# Patient Record
Sex: Female | Born: 1935 | Race: White | Hispanic: No | Smoking: Never smoker
Health system: Southern US, Community
[De-identification: ages and names within clinical notes are randomized; demographics above are authoritative.]

## PROBLEM LIST (undated history)

## (undated) DIAGNOSIS — I10 Essential (primary) hypertension: Secondary | ICD-10-CM

## (undated) DIAGNOSIS — E119 Type 2 diabetes mellitus without complications: Secondary | ICD-10-CM

## (undated) DIAGNOSIS — L570 Actinic keratosis: Secondary | ICD-10-CM

## (undated) HISTORY — DX: Actinic keratosis: L57.0

## (undated) HISTORY — PX: BACK SURGERY: SHX140

---

## 2003-12-06 ENCOUNTER — Ambulatory Visit: Payer: Self-pay | Admitting: Internal Medicine

## 2003-12-28 ENCOUNTER — Ambulatory Visit: Payer: Self-pay | Admitting: Internal Medicine

## 2005-03-21 ENCOUNTER — Ambulatory Visit: Payer: Self-pay | Admitting: Internal Medicine

## 2006-05-30 ENCOUNTER — Ambulatory Visit: Payer: Self-pay | Admitting: Internal Medicine

## 2006-06-14 ENCOUNTER — Encounter: Admission: RE | Admit: 2006-06-14 | Discharge: 2006-06-14 | Payer: Self-pay | Admitting: Orthopedic Surgery

## 2006-06-21 ENCOUNTER — Ambulatory Visit: Payer: Self-pay | Admitting: Internal Medicine

## 2006-06-24 ENCOUNTER — Ambulatory Visit (HOSPITAL_COMMUNITY): Admission: RE | Admit: 2006-06-24 | Discharge: 2006-06-25 | Payer: Self-pay | Admitting: Orthopedic Surgery

## 2006-07-15 ENCOUNTER — Ambulatory Visit: Payer: Self-pay | Admitting: Internal Medicine

## 2006-08-05 ENCOUNTER — Encounter (INDEPENDENT_AMBULATORY_CARE_PROVIDER_SITE_OTHER): Payer: Self-pay | Admitting: Gastroenterology

## 2006-08-05 ENCOUNTER — Ambulatory Visit (HOSPITAL_COMMUNITY): Admission: RE | Admit: 2006-08-05 | Discharge: 2006-08-05 | Payer: Self-pay | Admitting: Gastroenterology

## 2006-10-29 ENCOUNTER — Ambulatory Visit: Payer: Self-pay | Admitting: Family Medicine

## 2006-10-29 DIAGNOSIS — M810 Age-related osteoporosis without current pathological fracture: Secondary | ICD-10-CM | POA: Insufficient documentation

## 2006-10-29 DIAGNOSIS — E1165 Type 2 diabetes mellitus with hyperglycemia: Secondary | ICD-10-CM | POA: Insufficient documentation

## 2006-10-29 DIAGNOSIS — I10 Essential (primary) hypertension: Secondary | ICD-10-CM | POA: Insufficient documentation

## 2006-10-29 DIAGNOSIS — E039 Hypothyroidism, unspecified: Secondary | ICD-10-CM | POA: Insufficient documentation

## 2006-10-29 DIAGNOSIS — E119 Type 2 diabetes mellitus without complications: Secondary | ICD-10-CM | POA: Insufficient documentation

## 2007-09-02 ENCOUNTER — Ambulatory Visit: Payer: Self-pay | Admitting: Internal Medicine

## 2008-06-29 DIAGNOSIS — Z86018 Personal history of other benign neoplasm: Secondary | ICD-10-CM

## 2008-06-29 HISTORY — DX: Personal history of other benign neoplasm: Z86.018

## 2008-09-02 ENCOUNTER — Ambulatory Visit: Payer: Self-pay | Admitting: Internal Medicine

## 2009-02-21 IMAGING — CR DG LUMBAR SPINE 2-3V
2 series · 2 of 2 positions shown · non-contrast
Comparison: none

CLINICAL DATA: Disc surgery L4-5. 
 PORTABLE LATERAL LUMBAR SPINE ? 2 VIEW:

[view not recorded (1 of 2)]
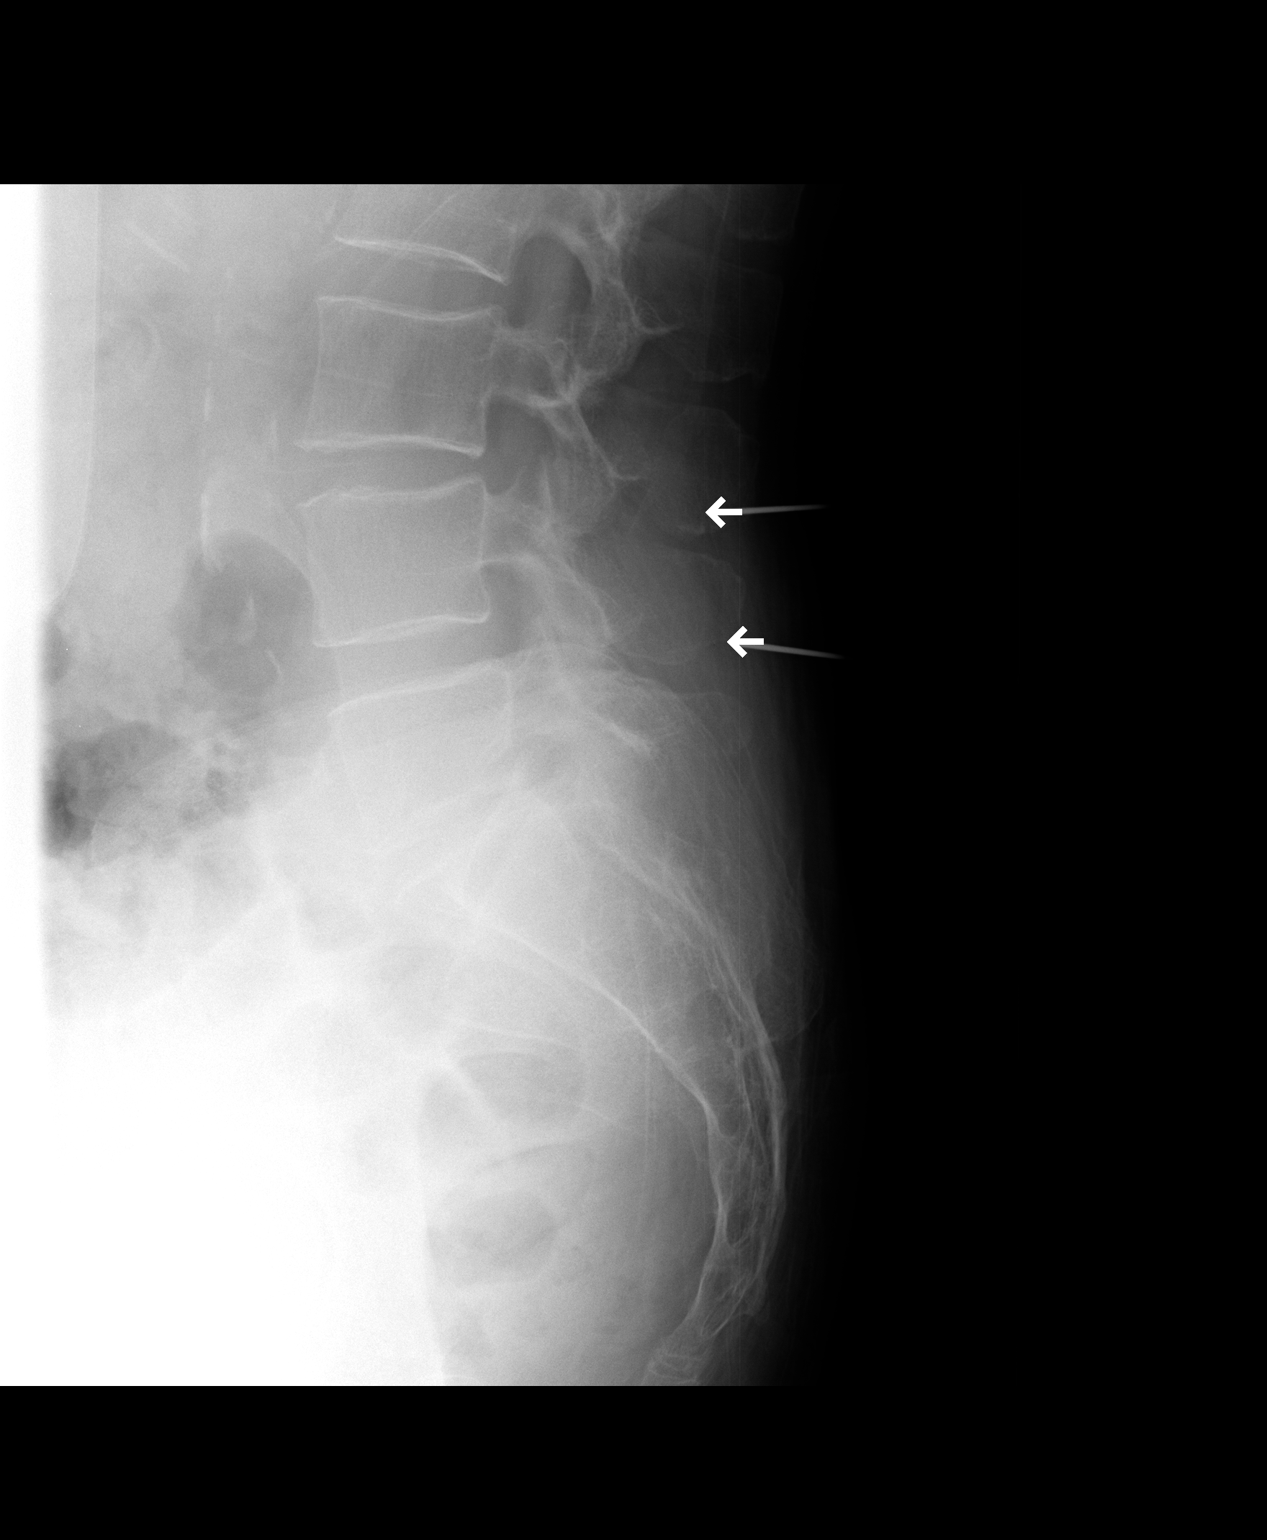

[view not recorded (2 of 2)]
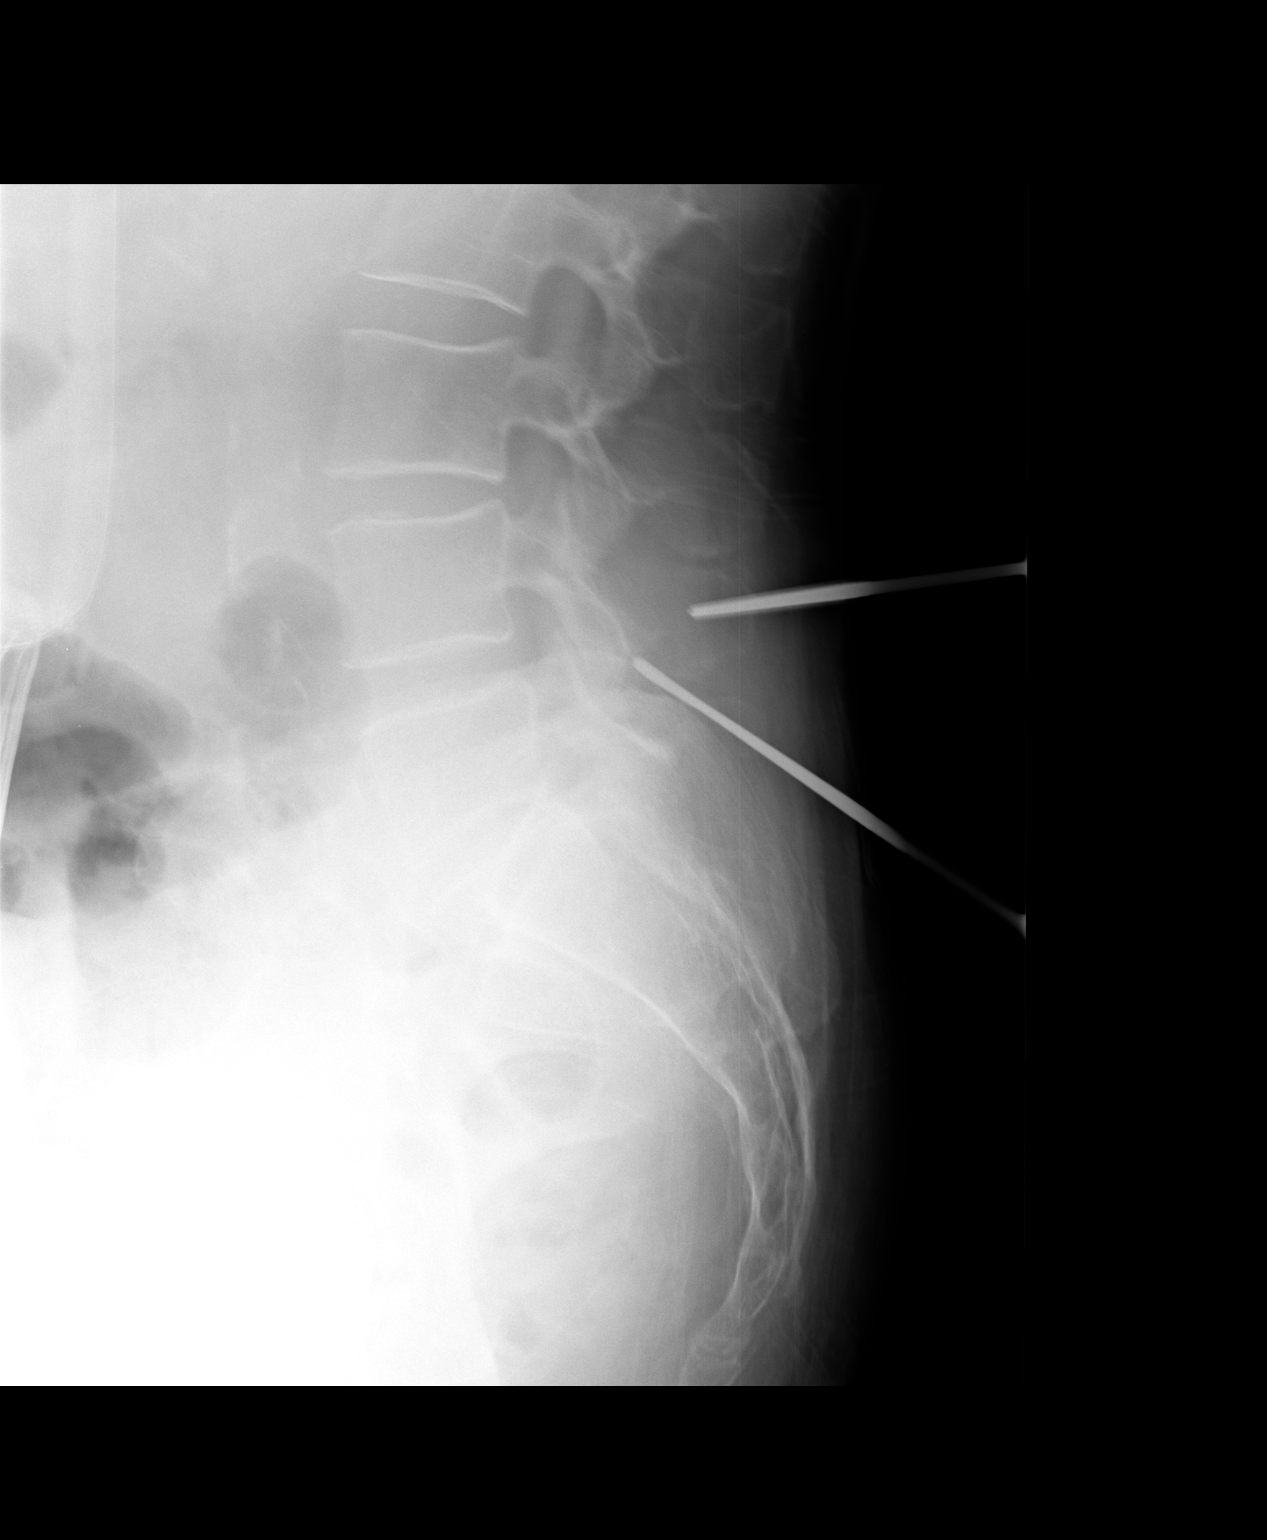

[2 of 2 positions shown; findings below may reference images not displayed]

FINDINGS: An initial film shows needles at the spinous processes of L3 and L4.  
 The second film shows a clamp on the spinous process of L4 with a probe directed at the L4-5 disc space level.
IMPRESSION: As discussed above.

## 2009-09-20 ENCOUNTER — Ambulatory Visit: Payer: Self-pay | Admitting: Internal Medicine

## 2010-07-11 NOTE — Op Note (Signed)
NAME:  Wanda Baird, Wanda Baird               ACCOUNT NO.:  [id number]   MEDICAL RECORD NO.:  [id number]          PATIENT TYPE:  AMB   LOCATION:  ENDO                         FACILITY:  [facility]   PHYSICIAN:  Anselmo Rod, M.D.  DATE OF BIRTH:  1927-09-12   DATE OF PROCEDURE:  08/05/2006  DATE OF DISCHARGE:                               OPERATIVE REPORT   PROCEDURE PERFORMED:  Colonoscopy with snare polypectomy x5.   ENDOSCOPIST:  Anselmo Rod, M.D.   INSTRUMENT USED:  Pentax video colonoscope.   INDICATIONS FOR PROCEDURE:  A 77 year old white female with a history of  change in bowel habits, undergoing a screening colonoscopy to rule out  colonic polyps, masses, etc.   PREPROCEDURE PREPARATION:  Informed consent was procured from the  patient. The patient fasted for 4 hours prior to the procedure and was  prepped with MoviPrep the night of and the morning of the procedure.  Risks and benefits of the procedure, including a 10% miss rate of cancer  and polyp, were discussed with the patient as well.   PREPROCEDURE PHYSICAL EXAMINATION:  VITAL SIGNS:  The patient had stable  vital signs.  NECK:  Supple.  CHEST:  Clear to auscultation.  CARDIOVASCULAR:  S1, S2 regular.  ABDOMEN:  Soft, with normal bowel sounds.   DESCRIPTION OF THE PROCEDURE:  The patient was placed in the left  lateral decubitus position, sedated with 50 mcg of Fentanyl and 4 mg of  Versed given intravenously in slow incremental doses.  Once the patient  was adequately sedated and maintained on low-flow oxygen and continuous  cardiac monitoring, the Pentax video colonoscope was advanced from the  rectum to the cecum.  The appendiceal orifice and ileocecal valve were  clearly visualized and photographed.  The terminal ileum appeared  healthy and without lesions.  Four flat polyps were removed by a hot  snare, one from the proximal right colon, two from the mid-right colon,  and one from the distal right colon.  There  was evidence of sigmoid  diverticulosis with stool in some of the diverticula, and one flat polyp  was removed by a hot snare from the rectum.  Retroflexion in the rectum  revealed small internal hemorrhoids.  The patient tolerated the  procedure well, without immediate complications.   IMPRESSION:  1. Small nonbleeding internal hemorrhoids.  2. Flat polyp removed by a hot snare from the rectum.  3. Sigmoid diverticulosis.  4. Four flat polyps removed by a hot snare from the right colon.  5. Normal terminal ileum.   RECOMMENDATIONS:  1. Await pathology results.  2. Avoid all nonsteroidals, including aspirin, for the next 4 weeks.  3. Repeat colonoscopy depending on pathology results.  4. Outpatient followup in the next 2 weeks for further      recommendations.      Anselmo Rod, M.D.  Electronically Signed     JNM/MEDQ  D:  08/05/2006  T:  08/05/2006  Job:  [id number]   cc:   Alonna Buckler, M.D.

## 2010-07-11 NOTE — Op Note (Signed)
NAME:  RYLIEE, FIGGE               ACCOUNT NO.:  [id number]   MEDICAL RECORD NO.:  [id number]          PATIENT TYPE:  AMB   LOCATION:  ENDO                         FACILITY:  [facility]   PHYSICIAN:  Anselmo Rod, M.D.  DATE OF BIRTH:  15-Dec-1927   DATE OF PROCEDURE:  08/05/2006  DATE OF DISCHARGE:                               OPERATIVE REPORT   PROCEDURE PERFORMED:  Esophagogastroduodenoscopy with gastric biopsies x  4.   INSTRUMENT USED:  Pentax video panendoscope.   INDICATIONS FOR PROCEDURE:  A 77 year old, white female with a history  of abdominal pain and reflux undergoing an esophagogastroduodenoscopy to  rule out peptic ulcer disease, esophagitis, gastritis, etc. The patient  had a 12 pound weight loss in the last year.  Preprocedure preparation,  informed consent was obtained from the patient.  The patient fasted for  4 hours prior to the procedure.  The risks and benefits of the procedure  were discussed with the patient in great detail.   PRE-PROCEDURE PHYSICAL:  The patient had stable vital signs.  Neck  supple.  Chest clear to auscultation. S1 and S2 regular; abdomen soft  with normal bowel sounds.   DESCRIPTION OF PROCEDURE:  The patient was placed in the left lateral  decubitus position and sedated with 50 mg of Fentanyl and 6 mg of Versed  given intravenously in incremental doses.  Once the patient was  adequately sedated, maintained on low flow oxygen, continuous cardiac  monitoring.  The Pentex video panendoscope was advanced through the  mouth, over the tongue and into the esophagus under direct vision.  The  entire esophagus was widely patent with no evidence of ring, stricture,  mass, esophagitis or Barrett mucosa.  The scope was then advanced into  the stomach.  Nodular changes were noticed throughout the gastric mucosa  with no problem in changes in the proximal stomach.  Mild antral  gastritis was present.  Biopsies were done to rule out the presence of  H  pylori by pathology.  No hiatal hernia was seen.  Normal high  retroflexion of the proximal small bowel appeared normal.   The patient tolerated the procedure well without complications.   IMPRESSION:  1. Normal appearing esophagus and healthy Z line.  2. Nodular changes in the stomach with mild antral gastritis, biopsy      done to rule out the presence of H pylori by pathology.  3. Normal appearing gastroesophageal junction on high retroflexion.  4. Normal proximal small bowel.   RECOMMENDATIONS:  1. Await the pathology results.  2. Avoid all nonsteroidal anti-inflammatory and aspirin for now.  3. Follow antireflux precautions and use over the counter proton pump      inhibitor as needed.  4. Proceed with colonoscopy at this time.  5. Further recommendations made thereafter.      Anselmo Rod, M.D.  Electronically Signed     JNM/MEDQ  D:  08/05/2006  T:  08/05/2006  Job:  [id number]   cc:   Alonna Buckler, M.D.

## 2010-07-14 NOTE — Op Note (Signed)
[name], [name]               ACCOUNT NO.:  [id number]   MEDICAL RECORD NO.:  [id number]          PATIENT TYPE:  AMB   LOCATION:  DAY                          FACILITY:  [facility]   PHYSICIAN:  Georges Lynch. Gioffre, M.D.DATE OF BIRTH:  [birth year]   DATE OF PROCEDURE:  06/24/2006  DATE OF DISCHARGE:                               OPERATIVE REPORT   SURGEON:  Dr. Darrelyn Hillock.   ASSISTANT:  Dr. Marlowe Kays.   PREOPERATIVE DIAGNOSES:  1. 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  2. Large herniated lumbar disk at L4-5 on the right.  3. Partial foot drop on the right.   OPERATION:  1. Decompression for lateral severe recess stenosis at L4-5 on the      right.  2. Microdiskectomy at L4-5 on the right with foraminotomies.   PROCEDURE:  Under general anesthesia with the patient on a spinal frame,  routine orthopedic prep and draping of  the back was carried out.  She  had 1 gram of IV Ancef preop.  At this time two needles were placed in  the back for localization purposes and x-ray was taken.  An incision  then was made over the L4-5 interspace.  Bleeders identified and  cauterized.  I separated the muscle from the lamina and spinous process  in the usual fashion.  I then went down and took another x-ray to verify  our exact position.   Following that I did a hemilaminectomy at L4-5 on the right and I noted  that the ligamentum flavum was markedly thickened.  She had severe  recess stenosis. so we went far out laterally to decompress the recess  and removed the ligamentum flavum with great care not to injure the  dura.  The microscope was used during the procedure.  I then identified  the L5 root.  It was quite tense.  I did a foraminotomy to free up the  root.  There were several large lateral recessed veins that were  cauterized with a bipolar.   I then made a cruciate incision in the posterior longitudinal ligament  while we protected the dura with the D'Errico  retractor.  The large  amount of disk then was removed.  We utilized a nerve hook and the  Epstein curettes to go up under the subligamentous space and free up the  root.  The root was traced out into the foramina and it was now free.  The lateral recess was thoroughly decompressed.  We were now easily able  to move the root and the dura without any problem.  We thoroughly  irrigated out the area and loosely applied some thrombin-soaked Gelfoam.   I then closed the wound in layers in the usual fashion except the very  proximal and distal deep parts of the wound were slightly left open for  drainage purposes.  Following that the wound then was closed in layers  in the usual  fashion.  I injected 20 mL of 0.5% Marcaine and epinephrine into  the  muscle and soft tissue.  The skin was closed metal staples.  A sterile  Neosporin dressing was applied.  Note prior to leaving surgery she had  15 mg of Toradol IV.  After sterile dressings were applied the patient  left the operating room in satisfactory condition.           ______________________________  Georges Lynch Darrelyn Hillock, M.D.     RAG/MEDQ  D:  06/24/2006  T:  06/25/2006  Job:  [id number]   cc:   Windy Fast A. Darrelyn Hillock, M.D.  Fax: [phone]

## 2010-11-30 ENCOUNTER — Ambulatory Visit: Payer: Self-pay | Admitting: Internal Medicine

## 2011-12-03 ENCOUNTER — Ambulatory Visit: Payer: Self-pay | Admitting: Internal Medicine

## 2012-12-03 ENCOUNTER — Ambulatory Visit: Payer: Self-pay | Admitting: Internal Medicine

## 2013-12-09 ENCOUNTER — Ambulatory Visit: Payer: Self-pay | Admitting: Family Medicine

## 2013-12-24 DIAGNOSIS — C4492 Squamous cell carcinoma of skin, unspecified: Secondary | ICD-10-CM

## 2013-12-24 HISTORY — DX: Squamous cell carcinoma of skin, unspecified: C44.92

## 2014-11-20 ENCOUNTER — Encounter: Payer: Self-pay | Admitting: Emergency Medicine

## 2014-11-20 ENCOUNTER — Emergency Department
Admission: EM | Admit: 2014-11-20 | Discharge: 2014-11-21 | Disposition: A | Discharge status: Home / Self Care | Payer: Medicare Other | Attending: Emergency Medicine | Admitting: Emergency Medicine

## 2014-11-20 DIAGNOSIS — R112 Nausea with vomiting, unspecified: Secondary | ICD-10-CM | POA: Insufficient documentation

## 2014-11-20 DIAGNOSIS — I10 Essential (primary) hypertension: Secondary | ICD-10-CM | POA: Diagnosis not present

## 2014-11-20 DIAGNOSIS — E119 Type 2 diabetes mellitus without complications: Secondary | ICD-10-CM | POA: Insufficient documentation

## 2014-11-20 HISTORY — DX: Type 2 diabetes mellitus without complications: E11.9

## 2014-11-20 LAB — COMPREHENSIVE METABOLIC PANEL
ALBUMIN: 4.4 g/dL (ref 3.5–5.0)
ALK PHOS: 49 U/L (ref 38–126)
ALT: 14 U/L (ref 14–54)
ANION GAP: 12 (ref 5–15)
AST: 22 U/L (ref 15–41)
BILIRUBIN TOTAL: 1.7 mg/dL — AB (ref 0.3–1.2)
BUN: 21 mg/dL — ABNORMAL HIGH (ref 6–20)
CALCIUM: 9.4 mg/dL (ref 8.9–10.3)
CO2: 26 mmol/L (ref 22–32)
Chloride: 103 mmol/L (ref 101–111)
Creatinine, Ser: 0.69 mg/dL (ref 0.44–1.00)
GFR calc Af Amer: >60 mL/min (ref >60)
GLUCOSE: 199 mg/dL — AB (ref 65–99)
Potassium: 4 mmol/L (ref 3.5–5.1)
Sodium: 141 mmol/L (ref 135–145)
TOTAL PROTEIN: 7.3 g/dL (ref 6.5–8.1)

## 2014-11-20 LAB — URINALYSIS COMPLETE WITH MICROSCOPIC (ARMC ONLY)
BILIRUBIN URINE: NEGATIVE
Bacteria, UA: NONE SEEN
Glucose, UA: NEGATIVE mg/dL
Hgb urine dipstick: NEGATIVE
KETONES UR: NEGATIVE mg/dL
LEUKOCYTES UA: NEGATIVE
NITRITE: NEGATIVE
Protein, ur: 30 mg/dL — AB
Specific Gravity, Urine: 1.024 (ref 1.005–1.030)
pH: 5 (ref 5.0–8.0)

## 2014-11-20 LAB — CBC
HCT: 41.7 % (ref 35.0–47.0)
HEMOGLOBIN: 14.6 g/dL (ref 12.0–16.0)
MCH: 30.9 pg (ref 26.0–34.0)
MCHC: 35.1 g/dL (ref 32.0–36.0)
MCV: 88.3 fL (ref 80.0–100.0)
Platelets: 203 10*3/uL (ref 150–440)
RBC: 4.73 MIL/uL (ref 3.80–5.20)
RDW: 13.2 % (ref 11.5–14.5)
WBC: 9.4 10*3/uL (ref 3.6–11.0)

## 2014-11-20 LAB — LIPASE, BLOOD: Lipase: 23 U/L (ref 22–51)

## 2014-11-20 MED ORDER — ONDANSETRON HCL 4 MG/2ML IJ SOLN
INTRAMUSCULAR | Status: AC
  Administered 2014-11-20: 4 mg via INTRAVENOUS
  Filled 2014-11-20: qty 2

## 2014-11-20 MED ORDER — SODIUM CHLORIDE 0.9 % IV BOLUS (SEPSIS)
1000.0000 mL | Freq: Once | INTRAVENOUS | Status: AC
  Administered 2014-11-20: 1000 mL via INTRAVENOUS

## 2014-11-20 MED ORDER — ONDANSETRON HCL 4 MG/2ML IJ SOLN
4.0000 mg | INTRAMUSCULAR | Status: AC
  Administered 2014-11-20: 4 mg via INTRAVENOUS
  Filled 2014-11-20: qty 2

## 2014-11-20 MED ORDER — ONDANSETRON HCL 4 MG PO TABS: ORAL_TABLET | ORAL | Status: DC

## 2014-11-20 MED ORDER — ONDANSETRON HCL 4 MG/2ML IJ SOLN
4.0000 mg | Freq: Once | INTRAMUSCULAR | Status: AC
  Administered 2014-11-20: 4 mg via INTRAVENOUS

## 2014-11-20 NOTE — ED Notes (Signed)
Pt given po fluid challenge

## 2014-11-20 NOTE — ED Notes (Signed)
States woke up at 5 am with nausea and vomiting, denies abd pain, chest pain or SOB. Denies dysuria.

## 2014-11-20 NOTE — Discharge Instructions (Signed)
You have been seen in the Emergency Department (ED) today for nausea and vomiting.  Your work up today has not shown a clear cause for your symptoms. You have been prescribed Zofran; please use as prescribed as needed for your nausea.  Follow up with your doctor as soon as possible regarding todays emergent visit and your symptoms of nausea.   Return to the ED if you develop abdominal, bloody vomiting, bloody diarrhea, if you are unable to tolerate fluids due to vomiting, or if you develop other symptoms that concern you.   Nausea and Vomiting Nausea is a sick feeling that often comes before throwing up (vomiting). Vomiting is a reflex where stomach contents come out of your mouth. Vomiting can cause severe loss of body fluids (dehydration). Children and elderly adults can become dehydrated quickly, especially if they also have diarrhea. Nausea and vomiting are symptoms of a condition or disease. It is important to find the cause of your symptoms. CAUSES   Direct irritation of the stomach lining. This irritation can result from increased acid production (gastroesophageal reflux disease), infection, food poisoning, taking certain medicines (such as nonsteroidal anti-inflammatory drugs), alcohol use, or tobacco use.  Signals from the brain.These signals could be caused by a headache, heat exposure, an inner ear disturbance, increased pressure in the brain from injury, infection, a tumor, or a concussion, pain, emotional stimulus, or metabolic problems.  An obstruction in the gastrointestinal tract (bowel obstruction).  Illnesses such as diabetes, hepatitis, gallbladder problems, appendicitis, kidney problems, cancer, sepsis, atypical symptoms of a heart attack, or eating disorders.  Medical treatments such as chemotherapy and radiation.  Receiving medicine that makes you sleep (general anesthetic) during surgery. DIAGNOSIS Your caregiver may ask for tests to be done if the problems do not  improve after a few days. Tests may also be done if symptoms are severe or if the reason for the nausea and vomiting is not clear. Tests may include:  Urine tests.  Blood tests.  Stool tests.  Cultures (to look for evidence of infection).  X-rays or other imaging studies. Test results can help your caregiver make decisions about treatment or the need for additional tests. TREATMENT You need to stay well hydrated. Drink frequently but in small amounts.You may wish to drink water, sports drinks, clear broth, or eat frozen ice pops or gelatin dessert to help stay hydrated.When you eat, eating slowly may help prevent nausea.There are also some antinausea medicines that may help prevent nausea. HOME CARE INSTRUCTIONS   Take all medicine as directed by your caregiver.  If you do not have an appetite, do not force yourself to eat. However, you must continue to drink fluids.  If you have an appetite, eat a normal diet unless your caregiver tells you differently.  Eat a variety of complex carbohydrates (rice, wheat, potatoes, bread), lean meats, yogurt, fruits, and vegetables.  Avoid high-fat foods because they are more difficult to digest.  Drink enough water and fluids to keep your urine clear or pale yellow.  If you are dehydrated, ask your caregiver for specific rehydration instructions. Signs of dehydration may include:  Severe thirst.  Dry lips and mouth.  Dizziness.  Dark urine.  Decreasing urine frequency and amount.  Confusion.  Rapid breathing or pulse. SEEK IMMEDIATE MEDICAL CARE IF:   You have blood or brown flecks (like coffee grounds) in your vomit.  You have black or bloody stools.  You have a severe headache or stiff neck.  You are confused.  You have severe abdominal pain.  You have chest pain or trouble breathing.  You do not urinate at least once every 8 hours.  You develop cold or clammy skin.  You continue to vomit for longer than 24 to 48  hours.  You have a fever. MAKE SURE YOU:   Understand these instructions.  Will watch your condition.  Will get help right away if you are not doing well or get worse. Document Released: 02/12/2005 Document Revised: 05/07/2011 Document Reviewed: 07/12/2010 [facility] Patient Information 2015 [place], [place]. This information is not intended to replace advice given to you by your health care provider. Make sure you discuss any questions you have with your health care provider.

## 2014-11-20 NOTE — ED Provider Notes (Signed)
[facility] Emergency Department Provider Note  ____________________________________________  Time seen: Approximately 8:51 PM  I have reviewed the triage vital signs and the nursing notes.   HISTORY  Chief Complaint Emesis    HPI Wanda Baird is a 81 y.o. female with no significant past medical history other than uncomplicated diabetes who presents with multiple episodes of nausea and vomiting today.  She states that she thinks that she worked a little bit too hard yesterday and did not drink enough fluid.  That she ate a bowl of cereal right before bed and thinks that this was started her nausea.She denies ever having fever/chills, chest pain, shortness of breath, abdominal pain, diarrhea, dysuria.  She states that she just feels ill and tends to vomit whenever she takes anything by mouth.  The symptoms are serious because of the number of episodes of emesis, but other than that she states that she feels well.   Past Medical History  Diagnosis Date  . Diabetes mellitus without complication     Patient Active Problem List   Diagnosis Date Noted  . HYPOTHYROIDISM 10/29/2006  . DIABETES-TYPE 2 10/29/2006  . HYPERTENSION, ESSENTIAL NOS 10/29/2006  . OSTEOPOROSIS 10/29/2006    Past Surgical History  Procedure Laterality Date  . Back surgery      Current Outpatient Rx  Name  Route  Sig  Dispense  Refill  . ondansetron (ZOFRAN) 4 MG tablet      Take 1-2 tabs by mouth every 8 hours as needed for nausea/vomiting   30 tablet   0     Allergies Codeine  No family history on file.  Social History Social History  Substance Use Topics  . Smoking status: Never Smoker   . Smokeless tobacco: None  . Alcohol Use: No    Review of Systems Constitutional: No fever/chills Eyes: No visual changes. ENT: No sore throat. Cardiovascular: Denies chest pain. Respiratory: Denies shortness of breath. Gastrointestinal: No abdominal pain.  Multiple  episodes of emesis.  No diarrhea.  No constipation. Genitourinary: Negative for dysuria. Musculoskeletal: Negative for back pain. Skin: Negative for rash. Neurological: Negative for headaches, focal weakness or numbness.  10-point ROS otherwise negative.  ____________________________________________   PHYSICAL EXAM:  VITAL SIGNS: ED Triage Vitals  Enc Vitals Group     BP 11/20/14 1852 125/57 mmHg     Pulse Rate 11/20/14 1852 99     Resp 11/20/14 1852 20     Temp 11/20/14 1852 98.7 F (37.1 C)     Temp Source 11/20/14 1852 Oral     SpO2 11/20/14 1852 98 %     Weight 11/20/14 1852 149 lb (67.586 kg)     Height 11/20/14 1852 5\' 4"  (1.626 m)     Head Cir --      Peak Flow --      Pain Score --      Pain Loc --      Pain Edu? --      Excl. in [place]? --     Constitutional: Alert and oriented. Well appearing and in no acute distress.  Appears significantly younger than her chronological age. Eyes: Conjunctivae are normal. PERRL. EOMI. Head: Atraumatic. Nose: No congestion/rhinnorhea. Mouth/Throat: Mucous membranes are moist.  Oropharynx non-erythematous. Neck: No stridor.   Cardiovascular: Normal rate, regular rhythm. Grossly normal heart sounds.  Good peripheral circulation. Respiratory: Normal respiratory effort.  No retractions. Lungs CTAB. Gastrointestinal: Soft and nontender. No distention. No abdominal bruits. No CVA tenderness. Musculoskeletal: No  lower extremity tenderness nor edema.  No joint effusions. Neurologic:  Normal speech and language. No gross focal neurologic deficits are appreciated.  Skin:  Skin is warm, dry and intact. No rash noted. Psychiatric: Mood and affect are normal. Speech and behavior are normal.  The patient is happy and joking with me and in no discomfort at this time.  ____________________________________________   LABS (all labs ordered are listed, but only abnormal results are displayed)  Labs Reviewed  COMPREHENSIVE METABOLIC PANEL -  Abnormal; Notable for the following:    Glucose, Bld 199 (*)    BUN 21 (*)    Total Bilirubin 1.7 (*)    All other components within normal limits  URINALYSIS COMPLETEWITH MICROSCOPIC ([facility] ONLY) - Abnormal; Notable for the following:    Color, Urine YELLOW (*)    APPearance CLEAR (*)    Protein, ur 30 (*)    Squamous Epithelial / LPF 0-5 (*)    All other components within normal limits  LIPASE, BLOOD  CBC   ____________________________________________  EKG  ED ECG REPORT I, FORBACH, CORY, the attending physician, personally viewed and interpreted this ECG.  Date: 11/20/2014 EKG Time: 23:29 Rate: 79 Rhythm: normal sinus rhythm QRS Axis: normal Intervals: normal ST/T Wave abnormalities: normal Conduction Disutrbances: none.  Low voltage EKG Narrative Interpretation: unremarkable.  No evidence of acute ischemia  ____________________________________________  RADIOLOGY   No results found.  ____________________________________________   PROCEDURES  Procedure(s) performed: None  Critical Care performed: No ____________________________________________   INITIAL IMPRESSION / ASSESSMENT AND PLAN / ED COURSE  Pertinent labs & imaging results that were available during my care of the patient were reviewed by me and considered in my medical decision making (see chart for details).  The patient is extraordinarily well appearing for her age and feels much better according to her own report.  I will give her additional nausea medicine and then try by mouth challenge.  I fully anticipated obtaining a CT scan before I went into the room given her age, now well-appearing she looks and feels, if she is able to tolerate by mouth intake I believe she is appropriate for discharge with antiemetics.  I will also obtain an EKG as a general screening exam.   (Note that documentation was delayed due to multiple ED patients requiring immediate care.)   Patient remained well-appearing  on re-examination, and was able to tolerate PO intake.  She very much wants to go home, which I feel is appropriate.  I gave her my usual and customary return precautions. NAD, VSS, no abd tenderness on reassessment, no additional vomiting. ____________________________________________  FINAL CLINICAL IMPRESSION(S) / ED DIAGNOSES  Final diagnoses:  Nausea and vomiting, vomiting of unspecified type      NEW MEDICATIONS STARTED DURING THIS VISIT:  Discharge Medication List as of 11/20/2014 11:28 PM    START taking these medications   Details  ondansetron (ZOFRAN) 4 MG tablet Take 1-2 tabs by mouth every 8 hours as needed for nausea/vomiting, Print         Hinda Kehr, MD 11/21/14 6578

## 2015-03-21 ENCOUNTER — Ambulatory Visit
Admission: RE | Admit: 2015-03-21 | Discharge: 2015-03-21 | Disposition: A | Discharge status: Home / Self Care | Payer: Medicare Other | Source: Ambulatory Visit | Attending: Family Medicine | Admitting: Family Medicine

## 2015-03-21 ENCOUNTER — Other Ambulatory Visit: Payer: Self-pay | Admitting: Family Medicine

## 2015-03-21 DIAGNOSIS — Z1231 Encounter for screening mammogram for malignant neoplasm of breast: Secondary | ICD-10-CM

## 2015-07-29 ENCOUNTER — Encounter: Payer: Self-pay | Admitting: Emergency Medicine

## 2015-07-29 DIAGNOSIS — I10 Essential (primary) hypertension: Secondary | ICD-10-CM | POA: Diagnosis not present

## 2015-07-29 DIAGNOSIS — R197 Diarrhea, unspecified: Secondary | ICD-10-CM | POA: Insufficient documentation

## 2015-07-29 DIAGNOSIS — M810 Age-related osteoporosis without current pathological fracture: Secondary | ICD-10-CM | POA: Insufficient documentation

## 2015-07-29 DIAGNOSIS — E119 Type 2 diabetes mellitus without complications: Secondary | ICD-10-CM | POA: Insufficient documentation

## 2015-07-29 DIAGNOSIS — R112 Nausea with vomiting, unspecified: Secondary | ICD-10-CM | POA: Diagnosis present

## 2015-07-29 LAB — COMPREHENSIVE METABOLIC PANEL
ALBUMIN: 4.4 g/dL (ref 3.5–5.0)
ALT: 15 U/L (ref 14–54)
ANION GAP: 13 (ref 5–15)
AST: 20 U/L (ref 15–41)
Alkaline Phosphatase: 50 U/L (ref 38–126)
BUN: 23 mg/dL — ABNORMAL HIGH (ref 6–20)
CO2: 25 mmol/L (ref 22–32)
Calcium: 9.2 mg/dL (ref 8.9–10.3)
Chloride: 100 mmol/L — ABNORMAL LOW (ref 101–111)
Creatinine, Ser: 0.62 mg/dL (ref 0.44–1.00)
GFR calc non Af Amer: >60 mL/min (ref >60)
GLUCOSE: 251 mg/dL — AB (ref 65–99)
POTASSIUM: 3.4 mmol/L — AB (ref 3.5–5.1)
SODIUM: 138 mmol/L (ref 135–145)
TOTAL PROTEIN: 7.1 g/dL (ref 6.5–8.1)
Total Bilirubin: 1.1 mg/dL (ref 0.3–1.2)

## 2015-07-29 LAB — CBC
HEMATOCRIT: 42.7 % (ref 35.0–47.0)
HEMOGLOBIN: 14.5 g/dL (ref 12.0–16.0)
MCH: 30 pg (ref 26.0–34.0)
MCHC: 34 g/dL (ref 32.0–36.0)
MCV: 88.4 fL (ref 80.0–100.0)
Platelets: 173 10*3/uL (ref 150–440)
RBC: 4.83 MIL/uL (ref 3.80–5.20)
RDW: 13 % (ref 11.5–14.5)
WBC: 14 10*3/uL — ABNORMAL HIGH (ref 3.6–11.0)

## 2015-07-29 LAB — LIPASE, BLOOD: Lipase: 36 U/L (ref 11–51)

## 2015-07-29 MED ORDER — ONDANSETRON 4 MG PO TBDP
ORAL_TABLET | ORAL | Status: AC
  Administered 2015-07-29: 23:00:00
  Filled 2015-07-29: qty 1

## 2015-07-29 MED ORDER — ONDANSETRON 4 MG PO TBDP
4.0000 mg | ORAL_TABLET | Freq: Once | ORAL | Status: AC | PRN
  Administered 2015-07-29: 4 mg via ORAL

## 2015-07-29 NOTE — ED Notes (Signed)
Pt states nausea, vomiting and diarrhea that began around 1900 after eating dinner. Pt states "i have never felt so sick in my life". Pt denies abdominal pain, states "i just feel sick."

## 2015-07-30 ENCOUNTER — Emergency Department: Payer: Medicare Other

## 2015-07-30 ENCOUNTER — Emergency Department
Admission: EM | Admit: 2015-07-30 | Discharge: 2015-07-30 | Disposition: A | Discharge status: Home / Self Care | Payer: Medicare Other | Attending: Emergency Medicine | Admitting: Emergency Medicine

## 2015-07-30 DIAGNOSIS — R197 Diarrhea, unspecified: Secondary | ICD-10-CM | POA: Diagnosis not present

## 2015-07-30 DIAGNOSIS — R112 Nausea with vomiting, unspecified: Secondary | ICD-10-CM

## 2015-07-30 HISTORY — DX: Essential (primary) hypertension: I10

## 2015-07-30 MED ORDER — ONDANSETRON 4 MG PO TBDP: 4.0000 mg | ORAL_TABLET | Freq: Four times a day (QID) | ORAL | Status: DC | PRN

## 2015-07-30 MED ORDER — ONDANSETRON HCL 4 MG/2ML IJ SOLN
INTRAMUSCULAR | Status: AC
  Administered 2015-07-30: 4 mg via INTRAVENOUS
  Filled 2015-07-30: qty 2

## 2015-07-30 MED ORDER — SODIUM CHLORIDE 0.9 % IV BOLUS (SEPSIS)
1000.0000 mL | Freq: Once | INTRAVENOUS | Status: AC
  Administered 2015-07-30: 1000 mL via INTRAVENOUS

## 2015-07-30 MED ORDER — ONDANSETRON HCL 4 MG/2ML IJ SOLN
4.0000 mg | Freq: Once | INTRAMUSCULAR | Status: AC
  Administered 2015-07-30: 4 mg via INTRAVENOUS
  Filled 2015-07-30: qty 2

## 2015-07-30 MED ORDER — ONDANSETRON HCL 4 MG/2ML IJ SOLN
4.0000 mg | Freq: Once | INTRAMUSCULAR | Status: AC
  Administered 2015-07-30: 4 mg via INTRAVENOUS

## 2015-07-30 NOTE — Discharge Instructions (Signed)
You were seen in the emergency room for nausea vomiting and diarrhea. It is important that you follow up closely with your primary care doctor in the next couple of days.  As we discussed I did recommend that you get a CT scan today to make sure he did not have an "infection" or a possible "bowel blockage or obstruction", however after discussion you feel much better and have elected not to have this done but rather to monitor symptoms at home. I think this is okay, but she must return to the ER right away if you are to develop a fever, severe nausea, your pain becomes severe or worsens, you are unable to keep food down, begin vomiting any dark or bloody fluid, you develop any dark or bloody stools, feel dehydrated, or other new concerns or symptoms arise.   If you're unable to see her primary care doctor you may return to the emergency room or go to the [facility] in 1 or 2 days for reexam.    Nausea and Vomiting Nausea is a sick feeling that often comes before throwing up (vomiting). Vomiting is a reflex where stomach contents come out of your mouth. Vomiting can cause severe loss of body fluids (dehydration). Children and elderly adults can become dehydrated quickly, especially if they also have diarrhea. Nausea and vomiting are symptoms of a condition or disease. It is important to find the cause of your symptoms. CAUSES   Direct irritation of the stomach lining. This irritation can result from increased acid production (gastroesophageal reflux disease), infection, food poisoning, taking certain medicines (such as nonsteroidal anti-inflammatory drugs), alcohol use, or tobacco use.  Signals from the brain.These signals could be caused by a headache, heat exposure, an inner ear disturbance, increased pressure in the brain from injury, infection, a tumor, or a concussion, pain, emotional stimulus, or metabolic problems.  An obstruction in the gastrointestinal tract (bowel  obstruction).  Illnesses such as diabetes, hepatitis, gallbladder problems, appendicitis, kidney problems, cancer, sepsis, atypical symptoms of a heart attack, or eating disorders.  Medical treatments such as chemotherapy and radiation.  Receiving medicine that makes you sleep (general anesthetic) during surgery. DIAGNOSIS Your caregiver may ask for tests to be done if the problems do not improve after a few days. Tests may also be done if symptoms are severe or if the reason for the nausea and vomiting is not clear. Tests may include:  Urine tests.  Blood tests.  Stool tests.  Cultures (to look for evidence of infection).  X-rays or other imaging studies. Test results can help your caregiver make decisions about treatment or the need for additional tests. TREATMENT You need to stay well hydrated. Drink frequently but in small amounts.You may wish to drink water, sports drinks, clear broth, or eat frozen ice pops or gelatin dessert to help stay hydrated.When you eat, eating slowly may help prevent nausea.There are also some antinausea medicines that may help prevent nausea. HOME CARE INSTRUCTIONS   Take all medicine as directed by your caregiver.  If you do not have an appetite, do not force yourself to eat. However, you must continue to drink fluids.  If you have an appetite, eat a normal diet unless your caregiver tells you differently.  Eat a variety of complex carbohydrates (rice, wheat, potatoes, bread), lean meats, yogurt, fruits, and vegetables.  Avoid high-fat foods because they are more difficult to digest.  Drink enough water and fluids to keep your urine clear or pale yellow.  If  you are dehydrated, ask your caregiver for specific rehydration instructions. Signs of dehydration may include:  Severe thirst.  Dry lips and mouth.  Dizziness.  Dark urine.  Decreasing urine frequency and amount.  Confusion.  Rapid breathing or pulse. SEEK IMMEDIATE MEDICAL  CARE IF:   You have blood or brown flecks (like coffee grounds) in your vomit.  You have black or bloody stools.  You have a severe headache or stiff neck.  You are confused.  You have severe abdominal pain.  You have chest pain or trouble breathing.  You do not urinate at least once every 8 hours.  You develop cold or clammy skin.  You continue to vomit for longer than 24 to 48 hours.  You have a fever. MAKE SURE YOU:   Understand these instructions.  Will watch your condition.  Will get help right away if you are not doing well or get worse.   This information is not intended to replace advice given to you by your health care provider. Make sure you discuss any questions you have with your health care provider.   Document Released: 02/12/2005 Document Revised: 05/07/2011 Document Reviewed: 07/12/2010 Elsevier Interactive Patient Education [employer].

## 2015-07-30 NOTE — ED Provider Notes (Signed)
[facility] Emergency Department Provider Note  ____________________________________________  Time seen: Approximately 120 AM  I have reviewed the triage vital signs and the nursing notes.   HISTORY  Chief Complaint Emesis and Diarrhea    HPI Wanda Baird is a 82 y.o. female presents for evaluation rather abrupt onset nausea vomiting and diarrhea just after eating dinner about 45 minutes. She reports that she thinks she has "food poisoning" possibly from eating a meal prepared at home. She reports fairly severe nausea but very minimal discomfort and crampy abdominal pain. No fevers chills or chest pain. No shortness of breath.  She reports she had about 3 loose stools, watery and nonbloody. She also vomited 3 or 4 times with nausea, nonbloody nonblack and reportedly she says it was "yellow or acid".   Past Medical History  Diagnosis Date  . Diabetes mellitus without complication ([place])   . Hypertension     Patient Active Problem List   Diagnosis Date Noted  . HYPOTHYROIDISM 10/29/2006  . DIABETES-TYPE 2 10/29/2006  . HYPERTENSION, ESSENTIAL NOS 10/29/2006  . OSTEOPOROSIS 10/29/2006    Past Surgical History  Procedure Laterality Date  . Back surgery      Current Outpatient Rx  Name  Route  Sig  Dispense  Refill  . ondansetron (ZOFRAN ODT) 4 MG disintegrating tablet   Oral   Take 1 tablet (4 mg total) by mouth every 6 (six) hours as needed for nausea or vomiting.   20 tablet   0   . ondansetron (ZOFRAN) 4 MG tablet      Take 1-2 tabs by mouth every 8 hours as needed for nausea/vomiting   30 tablet   0     Allergies Codeine  Family History  Problem Relation Age of Onset  . Breast cancer Maternal Aunt     Social History Social History  Substance Use Topics  . Smoking status: Never Smoker   . Smokeless tobacco: Never Used  . Alcohol Use: No    Review of Systems Constitutional: No fever/chills Eyes: No visual  changes. ENT: No sore throat. Cardiovascular: Denies chest pain. Respiratory: Denies shortness of breath. Gastrointestinal: No abdominal pain.   No constipation. Genitourinary: Negative for dysuria. Musculoskeletal: Negative for back pain. Skin: Negative for rash. Neurological: Negative for headaches, focal weakness or numbness.  10-point ROS otherwise negative.  ____________________________________________   PHYSICAL EXAM:  VITAL SIGNS: ED Triage Vitals  Enc Vitals Group     BP 07/29/15 2303 170/78 mmHg     Pulse Rate 07/29/15 2303 91     Resp 07/29/15 2253 20     Temp 07/29/15 2303 98 F (36.7 C)     Temp Source 07/29/15 2303 Oral     SpO2 07/29/15 2303 100 %     Weight 07/29/15 2253 153 lb (69.4 kg)     Height 07/29/15 2253 5\' 4"  (1.626 m)     Head Cir --      Peak Flow --      Pain Score --      Pain Loc --      Pain Edu? --      Excl. in [place]? --    Constitutional: Alert and oriented. Well appearing and in no acute distress. Eyes: Conjunctivae are normal. PERRL. EOMI. Head: Atraumatic. Nose: No congestion/rhinnorhea. Mouth/Throat: Mucous membranes are moist.  Oropharynx non-erythematous. Neck: No stridor.   Cardiovascular: Normal rate, regular rhythm. Grossly normal heart sounds.  Good peripheral circulation. Respiratory: Normal respiratory effort.  No retractions. Lungs CTAB. Gastrointestinal: Soft and nontender. No distention. No abdominal bruits. Negative Murphy. No pain at McBurney's point. No rebound guarding or peritonitis throughout the abdomen. No CVA tenderness. Musculoskeletal: No lower extremity tenderness nor edema.  No joint effusions. Neurologic:  Normal speech and language. No gross focal neurologic deficits are appreciated. Skin:  Skin is warm, dry and intact. No rash noted. Psychiatric: Mood and affect are normal. Speech and behavior are normal.  ____________________________________________   LABS (all labs ordered are listed, but only abnormal  results are displayed)  Labs Reviewed  COMPREHENSIVE METABOLIC PANEL - Abnormal; Notable for the following:    Potassium 3.4 (*)    Chloride 100 (*)    Glucose, Bld 251 (*)    BUN 23 (*)    All other components within normal limits  CBC - Abnormal; Notable for the following:    WBC 14.0 (*)    All other components within normal limits  LIPASE, BLOOD  URINALYSIS COMPLETEWITH MICROSCOPIC ([facility] ONLY)   ____________________________________________  EKG  Reviewed and interpreted by me at 2400 hrs. Ventricular rate 90 QRS 80 QTc 450 Reviewed and are read as normal sinus rhythm, very minimal nonspecific T-wave abnormality noted primarily in the tissue was a slight inversion, however morphology is similar to that seen in V1. There is some very minimal T-wave abnormality noted in V4 and V5, but no clear evidence of acute ischemia.Appears unchanged from 11/22/2014, no change noted. ____________________________________________  RADIOLOGY  I discussed with the patient the risks and benefits of abdominal CT scan. The present time I recommended she have a CT scan and discussed with her and her son. The patient does have an abdominal complaint but exam does not suggest acute surgical abdomen and my suspicion for intra-abdominal infection including appendicitis, cholecystitis, aaa, dissection, ischemia, perforation, pancreatitis, diverticulitis or other acute major intra-abdominal process is overall low. After discussing the risks and benefits including my recommendation for CT and the benefits of additional evaluation for diagnoses, ruling out infection/perforation/aaa/etc, but also discussing the risks including low, "well less than 1%," but not 0 risk of inducing cancers due to radiation and potential risks of contrast the patient indicated via our shared medical decision-making that she would not do a CAT scan. Rather if the patient does have worsening symptoms, develops a high fever, develops pain  or persistent discomfort in the right upper quadrant or right lower quadrant, or other new concerns arise they will come back to emergency room right away. As the patient's clinician I think this is a reasonable decision, though I did recommend CT, but having discussed general risks and benefits the patient still did not wish for this. She was initially also amenable to having an x-ray, but after hydration and Zofran and her symptoms went away and she also refused x-ray for the reason that she reports it is not necessary and she will come back if she has new concerns.  ____________________________________________   PROCEDURES  Procedure(s) performed: None  Critical Care performed: No  ____________________________________________   INITIAL IMPRESSION / ASSESSMENT AND PLAN / ED COURSE  Pertinent labs & imaging results that were available during my care of the patient were reviewed by me and considered in my medical decision making (see chart for details).  Patient transfer rather abrupt onset nausea vomiting and loose stool. Reported as loose nonbloody, and yellow acid the emesis. She is awake alert no distress with stable hemodynamics. EKG no acute changes. No cardiac or pulmonary symptoms. Her abdomen  is soft nontender nondistended without rebound guarding or evidence of peritonitis. I did discuss given her age and recommended she have a CT or x-rays, but the patient refused. I think is not unreasonable as she seems to be very genuinely understanding and gives reasoning that she could come back if symptoms return.  Labs notable for slight leukocytosis, and I did discuss the patient I cannot exclude causes such as infection or bowel obstruction. She does report she had her gallbladder and appendix removed previously.  ----------------------------------------- 3:30 AM on 07/30/2015 -----------------------------------------  Patient reports all symptoms resolved. She is trying a couple of  water, held it down for over 30 minutes and is in no distress. I'll provide her with a prescription for Zofran, and discussed careful follow-up and return precautions. Patient is very pleasant and agreeable, denies any symptoms presently. ____________________________________________   FINAL CLINICAL IMPRESSION(S) / ED DIAGNOSES  Final diagnoses:  Nausea vomiting and diarrhea      Delman Kitten, MD 07/30/15 [phone]

## 2015-07-30 NOTE — ED Notes (Signed)
Pt discharged to home.  Family member driving.  Discharge instructions reviewed.  Verbalized understanding.  No questions or concerns at this time.  Teach back verified.  Pt in NAD.  No items left in ED.   

## 2016-02-08 ENCOUNTER — Other Ambulatory Visit: Payer: Self-pay | Admitting: Family Medicine

## 2016-02-08 DIAGNOSIS — Z1231 Encounter for screening mammogram for malignant neoplasm of breast: Secondary | ICD-10-CM

## 2016-03-21 ENCOUNTER — Ambulatory Visit
Admission: RE | Admit: 2016-03-21 | Discharge: 2016-03-21 | Disposition: A | Discharge status: Home / Self Care | Payer: Medicare Other | Source: Ambulatory Visit | Attending: Family Medicine | Admitting: Family Medicine

## 2016-03-21 DIAGNOSIS — Z1231 Encounter for screening mammogram for malignant neoplasm of breast: Secondary | ICD-10-CM | POA: Diagnosis present

## 2017-03-19 ENCOUNTER — Other Ambulatory Visit: Payer: Self-pay | Admitting: Family Medicine

## 2017-03-19 DIAGNOSIS — Z1231 Encounter for screening mammogram for malignant neoplasm of breast: Secondary | ICD-10-CM

## 2017-04-23 ENCOUNTER — Ambulatory Visit
Admission: RE | Admit: 2017-04-23 | Discharge: 2017-04-23 | Disposition: A | Discharge status: Home / Self Care | Payer: Medicare Other | Source: Ambulatory Visit | Attending: Family Medicine | Admitting: Family Medicine

## 2017-04-23 DIAGNOSIS — Z1231 Encounter for screening mammogram for malignant neoplasm of breast: Secondary | ICD-10-CM

## 2018-04-30 ENCOUNTER — Other Ambulatory Visit: Payer: Self-pay | Admitting: Family Medicine

## 2018-04-30 DIAGNOSIS — Z1231 Encounter for screening mammogram for malignant neoplasm of breast: Secondary | ICD-10-CM

## 2018-07-31 ENCOUNTER — Emergency Department: Payer: Medicare Other

## 2018-07-31 ENCOUNTER — Observation Stay
Admission: EM | Admit: 2018-07-31 | Discharge: 2018-08-02 | Disposition: A | Discharge status: Home / Self Care | Payer: Medicare Other | Attending: Internal Medicine | Admitting: Internal Medicine

## 2018-07-31 DIAGNOSIS — S0990XA Unspecified injury of head, initial encounter: Secondary | ICD-10-CM | POA: Diagnosis not present

## 2018-07-31 DIAGNOSIS — M4854XA Collapsed vertebra, not elsewhere classified, thoracic region, initial encounter for fracture: Secondary | ICD-10-CM | POA: Diagnosis not present

## 2018-07-31 DIAGNOSIS — Z79899 Other long term (current) drug therapy: Secondary | ICD-10-CM | POA: Insufficient documentation

## 2018-07-31 DIAGNOSIS — E039 Hypothyroidism, unspecified: Secondary | ICD-10-CM | POA: Insufficient documentation

## 2018-07-31 DIAGNOSIS — R9431 Abnormal electrocardiogram [ECG] [EKG]: Secondary | ICD-10-CM | POA: Diagnosis not present

## 2018-07-31 DIAGNOSIS — E871 Hypo-osmolality and hyponatremia: Secondary | ICD-10-CM | POA: Diagnosis not present

## 2018-07-31 DIAGNOSIS — E119 Type 2 diabetes mellitus without complications: Secondary | ICD-10-CM | POA: Insufficient documentation

## 2018-07-31 DIAGNOSIS — I10 Essential (primary) hypertension: Secondary | ICD-10-CM | POA: Diagnosis not present

## 2018-07-31 DIAGNOSIS — K449 Diaphragmatic hernia without obstruction or gangrene: Secondary | ICD-10-CM | POA: Insufficient documentation

## 2018-07-31 DIAGNOSIS — M810 Age-related osteoporosis without current pathological fracture: Secondary | ICD-10-CM | POA: Diagnosis not present

## 2018-07-31 DIAGNOSIS — W010XXA Fall on same level from slipping, tripping and stumbling without subsequent striking against object, initial encounter: Secondary | ICD-10-CM | POA: Insufficient documentation

## 2018-07-31 DIAGNOSIS — Z1159 Encounter for screening for other viral diseases: Secondary | ICD-10-CM | POA: Diagnosis not present

## 2018-07-31 DIAGNOSIS — J449 Chronic obstructive pulmonary disease, unspecified: Secondary | ICD-10-CM | POA: Diagnosis not present

## 2018-07-31 DIAGNOSIS — W19XXXA Unspecified fall, initial encounter: Secondary | ICD-10-CM | POA: Diagnosis present

## 2018-07-31 DIAGNOSIS — E860 Dehydration: Secondary | ICD-10-CM | POA: Insufficient documentation

## 2018-07-31 DIAGNOSIS — M546 Pain in thoracic spine: Secondary | ICD-10-CM | POA: Diagnosis present

## 2018-07-31 LAB — CBC WITH DIFFERENTIAL/PLATELET
Abs Immature Granulocytes: 0.09 10*3/uL — ABNORMAL HIGH (ref 0.00–0.07)
Basophils Absolute: 0 10*3/uL (ref 0.0–0.1)
Basophils Relative: 0 %
Eosinophils Absolute: 0 10*3/uL (ref 0.0–0.5)
Eosinophils Relative: 0 %
HCT: 38.3 % (ref 36.0–46.0)
Hemoglobin: 13.6 g/dL (ref 12.0–15.0)
Immature Granulocytes: 1 %
Lymphocytes Relative: 10 %
Lymphs Abs: 1.3 10*3/uL (ref 0.7–4.0)
MCH: 30.7 pg (ref 26.0–34.0)
MCHC: 35.5 g/dL (ref 30.0–36.0)
MCV: 86.5 fL (ref 80.0–100.0)
Monocytes Absolute: 0.8 10*3/uL (ref 0.1–1.0)
Monocytes Relative: 6 %
Neutro Abs: 10.2 10*3/uL — ABNORMAL HIGH (ref 1.7–7.7)
Neutrophils Relative %: 83 %
Platelets: 185 10*3/uL (ref 150–400)
RBC: 4.43 MIL/uL (ref 3.87–5.11)
RDW: 11.6 % (ref 11.5–15.5)
WBC: 12.3 10*3/uL — ABNORMAL HIGH (ref 4.0–10.5)
nRBC: 0 % (ref 0.0–0.2)

## 2018-07-31 LAB — COMPREHENSIVE METABOLIC PANEL
ALT: 22 U/L (ref 0–44)
AST: 26 U/L (ref 15–41)
Albumin: 4.4 g/dL (ref 3.5–5.0)
Alkaline Phosphatase: 56 U/L (ref 38–126)
Anion gap: 12 (ref 5–15)
BUN: 20 mg/dL (ref 8–23)
CO2: 23 mmol/L (ref 22–32)
Calcium: 9 mg/dL (ref 8.9–10.3)
Chloride: 95 mmol/L — ABNORMAL LOW (ref 98–111)
Creatinine, Ser: 0.58 mg/dL (ref 0.44–1.00)
GFR calc Af Amer: >60 mL/min (ref >60)
GFR calc non Af Amer: >60 mL/min (ref >60)
Glucose, Bld: 216 mg/dL — ABNORMAL HIGH (ref 70–99)
Potassium: 3.8 mmol/L (ref 3.5–5.1)
Sodium: 130 mmol/L — ABNORMAL LOW (ref 135–145)
Total Bilirubin: 2.2 mg/dL — ABNORMAL HIGH (ref 0.3–1.2)
Total Protein: 6.9 g/dL (ref 6.5–8.1)

## 2018-07-31 LAB — TROPONIN I: Troponin I: <0.03 ng/mL (ref <0.03)

## 2018-07-31 LAB — LIPASE, BLOOD: Lipase: 29 U/L (ref 11–51)

## 2018-07-31 MED ORDER — ONDANSETRON HCL 4 MG/2ML IJ SOLN
INTRAMUSCULAR | Status: AC
  Filled 2018-07-31: qty 2

## 2018-07-31 MED ORDER — SODIUM CHLORIDE 0.9 % IV BOLUS
500.0000 mL | Freq: Once | INTRAVENOUS | Status: AC
  Administered 2018-07-31: 500 mL via INTRAVENOUS

## 2018-07-31 MED ORDER — ONDANSETRON HCL 4 MG/2ML IJ SOLN
4.0000 mg | Freq: Once | INTRAMUSCULAR | Status: AC
  Administered 2018-07-31: 4 mg via INTRAVENOUS

## 2018-07-31 MED ORDER — MORPHINE SULFATE (PF) 4 MG/ML IV SOLN
4.0000 mg | Freq: Once | INTRAVENOUS | Status: AC
  Administered 2018-07-31: 4 mg via INTRAVENOUS
  Filled 2018-07-31: qty 1

## 2018-07-31 NOTE — ED Provider Notes (Signed)
[facility] Emergency Department Provider Note  ____________________________________________  Time seen: Approximately 10:59 PM  I have reviewed the triage vital signs and the nursing notes.   HISTORY  Chief Complaint Fall    HPI Wanda Baird is a 85 y.o. female presents to the emergency department after a mechanical, non-syncopal fall.  Patient states that her feet were wet from washing off her porch when she walked inside and slipped on some tile.  Patient denies hitting her head or her neck.  No loss of consciousness occurred.  Patient states that she has had 10 out of 10 upper back pain with ambulation and nausea that has resulted in multiple episodes of emesis today.  She denies numbness or tingling in the upper or lower extremities or weakness.  Patient denies a history of similar falls in the past. No other alleviating measures have been attempted.        Past Medical History:  Diagnosis Date  . Diabetes mellitus without complication ([place])   . Hypertension     Patient Active Problem List   Diagnosis Date Noted  . Fall 07/31/2018  . HYPOTHYROIDISM 10/29/2006  . DIABETES-TYPE 2 10/29/2006  . HYPERTENSION, ESSENTIAL NOS 10/29/2006  . OSTEOPOROSIS 10/29/2006    Past Surgical History:  Procedure Laterality Date  . BACK SURGERY      Prior to Admission medications   Medication Sig Start Date End Date Taking? Authorizing Provider  ondansetron (ZOFRAN ODT) 4 MG disintegrating tablet Take 1 tablet (4 mg total) by mouth every 6 (six) hours as needed for nausea or vomiting. 07/30/15   Delman Kitten, MD  ondansetron (ZOFRAN) 4 MG tablet Take 1-2 tabs by mouth every 8 hours as needed for nausea/vomiting 11/20/14   Hinda Kehr, MD    Allergies Codeine and Percocet [oxycodone-acetaminophen]  Family History  Problem Relation Age of Onset  . Breast cancer Maternal Aunt     Social History Social History   Tobacco Use  . Smoking status:  Never Smoker  . Smokeless tobacco: Never Used  Substance Use Topics  . Alcohol use: No  . Drug use: No     Review of Systems  Constitutional: No fever/chills Eyes: No visual changes. No discharge ENT: No upper respiratory complaints. Cardiovascular: no chest pain. Respiratory: no cough. No SOB. Gastrointestinal: No abdominal pain. Patient has nausea and emesis. No diarrhea.  No constipation. Genitourinary: Negative for dysuria. No hematuria Musculoskeletal: Patient has back pain.  Skin: Negative for rash, abrasions, lacerations, ecchymosis. Neurological: Negative for headaches, focal weakness or numbness.   ____________________________________________   PHYSICAL EXAM:  VITAL SIGNS: ED Triage Vitals  Enc Vitals Group     BP 07/31/18 2035 (!) 179/65     Pulse Rate 07/31/18 2035 87     Resp 07/31/18 2035 16     Temp 07/31/18 2035 98.3 F (36.8 C)     Temp src --      SpO2 07/31/18 2035 95 %     Weight 07/31/18 2031 154 lb 5.2 oz (70 kg)     Height --      Head Circumference --      Peak Flow --      Pain Score 07/31/18 2035 6     Pain Loc --      Pain Edu? --      Excl. in [place]? --      Constitutional: Alert and oriented. Well appearing and in no acute distress. Eyes: Conjunctivae are normal. PERRL. EOMI. Head:  Atraumatic. ENT:      Nose: No congestion/rhinnorhea.      Mouth/Throat: Mucous membranes are moist.  Neck: No stridor.  No cervical spine tenderness to palpation. FROM Cardiovascular: Normal rate, regular rhythm. Normal S1 and S2.  Good peripheral circulation. Respiratory: Normal respiratory effort without tachypnea or retractions. Lungs CTAB. Good air entry to the bases with no decreased or absent breath sounds. Gastrointestinal: Bowel sounds 4 quadrants. Soft and nontender to palpation. No guarding or rigidity. No palpable masses. No distention. No CVA tenderness. Musculoskeletal: Full range of motion to all extremities. No gross deformities appreciated.   Patient has midline thoracic back pain to palpation. Neurologic:  Normal speech and language. No gross focal neurologic deficits are appreciated.  Skin:  Skin is warm, dry and intact. No rash noted. Psychiatric: Mood and affect are normal. Speech and behavior are normal. Patient exhibits appropriate insight and judgement.   ____________________________________________   LABS (all labs ordered are listed, but only abnormal results are displayed)  Labs Reviewed  CBC WITH DIFFERENTIAL/PLATELET - Abnormal; Notable for the following components:      Result Value   WBC 12.3 (*)    Neutro Abs 10.2 (*)    Abs Immature Granulocytes 0.09 (*)    All other components within normal limits  COMPREHENSIVE METABOLIC PANEL - Abnormal; Notable for the following components:   Sodium 130 (*)    Chloride 95 (*)    Glucose, Bld 216 (*)    Total Bilirubin 2.2 (*)    All other components within normal limits  LIPASE, BLOOD  TROPONIN I  URINALYSIS, COMPLETE (UACMP) WITH MICROSCOPIC   ____________________________________________  EKG   ____________________________________________  RADIOLOGY I personally viewed and evaluated these images as part of my medical decision making, as well as reviewing the written report by the radiologist.  Dg Chest 1 View  Result Date: 07/31/2018 CLINICAL DATA:  Upper back pain following a fall. EXAM: CHEST  1 VIEW COMPARISON:  06/21/2006. FINDINGS: The heart remains grossly normal in size. The lungs are mildly hyperexpanded and clear. Diffuse osteopenia, mild left glenohumeral joint degenerative changes and moderate bilateral AC joint degenerative changes. Minimal scoliosis. No fracture or pneumothorax seen. IMPRESSION: No acute abnormality. Mild changes of COPD. Electronically Signed   By: Claudie Revering M.D.   On: 07/31/2018 21:59   Ct Head Wo Contrast  Result Date: 07/31/2018 CLINICAL DATA:  Fall with head trauma EXAM: CT HEAD WITHOUT CONTRAST CT CERVICAL SPINE WITHOUT  CONTRAST TECHNIQUE: Multidetector CT imaging of the head and cervical spine was performed following the standard protocol without intravenous contrast. Multiplanar CT image reconstructions of the cervical spine were also generated. COMPARISON:  None. FINDINGS: CT HEAD FINDINGS Brain: There is no mass, hemorrhage or extra-axial collection. The size and configuration of the ventricles and extra-axial CSF spaces are normal. The brain parenchyma is normal, without evidence of acute or chronic infarction. Vascular: No abnormal hyperdensity of the major intracranial arteries or dural venous sinuses. No intracranial atherosclerosis. Skull: The visualized skull base, calvarium and extracranial soft tissues are normal. Sinuses/Orbits: No fluid levels or advanced mucosal thickening of the visualized paranasal sinuses. No mastoid or middle ear effusion. The orbits are normal. CT CERVICAL SPINE FINDINGS Alignment: No static subluxation. Facets are aligned. Occipital condyles are normally positioned. Skull base and vertebrae: No acute fracture. Soft tissues and spinal canal: No prevertebral fluid or swelling. No visible canal hematoma. Disc levels: No advanced spinal canal or neural foraminal stenosis. Right C2-3 and C3-4 facet hypertrophy.  Upper chest: No pneumothorax, pulmonary nodule or pleural effusion. Other: Normal visualized paraspinal cervical soft tissues. IMPRESSION: No acute abnormality of the head or cervical spine. Electronically Signed   By: Ulyses Jarred M.D.   On: 07/31/2018 22:21   Ct Cervical Spine Wo Contrast  Result Date: 07/31/2018 CLINICAL DATA:  Fall with head trauma EXAM: CT HEAD WITHOUT CONTRAST CT CERVICAL SPINE WITHOUT CONTRAST TECHNIQUE: Multidetector CT imaging of the head and cervical spine was performed following the standard protocol without intravenous contrast. Multiplanar CT image reconstructions of the cervical spine were also generated. COMPARISON:  None. FINDINGS: CT HEAD FINDINGS Brain:  There is no mass, hemorrhage or extra-axial collection. The size and configuration of the ventricles and extra-axial CSF spaces are normal. The brain parenchyma is normal, without evidence of acute or chronic infarction. Vascular: No abnormal hyperdensity of the major intracranial arteries or dural venous sinuses. No intracranial atherosclerosis. Skull: The visualized skull base, calvarium and extracranial soft tissues are normal. Sinuses/Orbits: No fluid levels or advanced mucosal thickening of the visualized paranasal sinuses. No mastoid or middle ear effusion. The orbits are normal. CT CERVICAL SPINE FINDINGS Alignment: No static subluxation. Facets are aligned. Occipital condyles are normally positioned. Skull base and vertebrae: No acute fracture. Soft tissues and spinal canal: No prevertebral fluid or swelling. No visible canal hematoma. Disc levels: No advanced spinal canal or neural foraminal stenosis. Right C2-3 and C3-4 facet hypertrophy. Upper chest: No pneumothorax, pulmonary nodule or pleural effusion. Other: Normal visualized paraspinal cervical soft tissues. IMPRESSION: No acute abnormality of the head or cervical spine. Electronically Signed   By: Ulyses Jarred M.D.   On: 07/31/2018 22:21   Ct Thoracic Spine Wo Contrast  Result Date: 07/31/2018 CLINICAL DATA:  Initial evaluation for acute trauma, mechanical fall. Back pain. EXAM: CT THORACIC AND LUMBAR SPINE WITHOUT CONTRAST TECHNIQUE: Multidetector CT imaging of the thoracic and lumbar spine was performed without contrast. Multiplanar CT image reconstructions were also generated. COMPARISON:  None. FINDINGS: CT THORACIC SPINE FINDINGS Alignment: Vertebral bodies normally aligned with preservation of the normal thoracic kyphosis. No listhesis. Vertebrae: Acute burst type compression fracture involving the T12 vertebral body with mild 20% central height loss with 3 mm bony retropulsion. No significant stenosis. Vertebral body heights otherwise  maintained without evidence for acute or chronic fracture. Few scattered sclerotic foci within the upper thoracic spine most consistent with benign bone islands. No other discrete or worrisome osseous lesions. Paraspinal and other soft tissues: Paraspinous soft tissues demonstrate no acute finding. Moderate aortic atherosclerosis. Prior cholecystectomy. Small hiatal hernia. Partially visualized lungs are grossly clear. Disc levels: Mild multilevel reactive endplate changes with disc desiccation present at T5-6, T6-7, T7-8, and T8-9. No significant stenosis within the thoracic spine. CT LUMBAR SPINE FINDINGS Segmentation: Standard. Lowest well-formed disc labeled the L5-S1 level. Alignment: Vertebral bodies normally aligned with preservation of the normal lumbar lordosis. No listhesis or malalignment. Vertebrae: Vertebral body heights maintained without evidence for acute or chronic fracture. Visualized sacrum and pelvis intact. SI joints approximated symmetric. No discrete osseous lesions. Paraspinal and other soft tissues: Paraspinous soft tissues demonstrate no acute abnormality. Moderate aorto bi-iliac atherosclerotic disease without visible aneurysm. Disc levels: Degenerative disc desiccation with mild disc bulging at L4-5. Chronic intervertebral disc space narrowing with diffuse disc bulge and reactive endplate changes at J8-J1. Moderate facet hypertrophy at L4-5 and L5-S1. No significant stenosis. IMPRESSION: CT THORACIC SPINE IMPRESSION 1. Acute burst type compression fracture involving the T12 vertebral body with up to 20% central height  loss and 3 mm bony retropulsion. No significant stenosis. 2. No other acute traumatic injury within the thoracic spine. CT LUMBAR SPINE IMPRESSION 1. No acute traumatic injury within the lumbar spine. 2. Mild moderate degenerate spondylolysis and facet arthrosis at L4-5 and L5-S1 without significant stenosis. Electronically Signed   By: Jeannine Boga M.D.   On:  07/31/2018 22:24   Ct Lumbar Spine Wo Contrast  Result Date: 07/31/2018 CLINICAL DATA:  Initial evaluation for acute trauma, mechanical fall. Back pain. EXAM: CT THORACIC AND LUMBAR SPINE WITHOUT CONTRAST TECHNIQUE: Multidetector CT imaging of the thoracic and lumbar spine was performed without contrast. Multiplanar CT image reconstructions were also generated. COMPARISON:  None. FINDINGS: CT THORACIC SPINE FINDINGS Alignment: Vertebral bodies normally aligned with preservation of the normal thoracic kyphosis. No listhesis. Vertebrae: Acute burst type compression fracture involving the T12 vertebral body with mild 20% central height loss with 3 mm bony retropulsion. No significant stenosis. Vertebral body heights otherwise maintained without evidence for acute or chronic fracture. Few scattered sclerotic foci within the upper thoracic spine most consistent with benign bone islands. No other discrete or worrisome osseous lesions. Paraspinal and other soft tissues: Paraspinous soft tissues demonstrate no acute finding. Moderate aortic atherosclerosis. Prior cholecystectomy. Small hiatal hernia. Partially visualized lungs are grossly clear. Disc levels: Mild multilevel reactive endplate changes with disc desiccation present at T5-6, T6-7, T7-8, and T8-9. No significant stenosis within the thoracic spine. CT LUMBAR SPINE FINDINGS Segmentation: Standard. Lowest well-formed disc labeled the L5-S1 level. Alignment: Vertebral bodies normally aligned with preservation of the normal lumbar lordosis. No listhesis or malalignment. Vertebrae: Vertebral body heights maintained without evidence for acute or chronic fracture. Visualized sacrum and pelvis intact. SI joints approximated symmetric. No discrete osseous lesions. Paraspinal and other soft tissues: Paraspinous soft tissues demonstrate no acute abnormality. Moderate aorto bi-iliac atherosclerotic disease without visible aneurysm. Disc levels: Degenerative disc  desiccation with mild disc bulging at L4-5. Chronic intervertebral disc space narrowing with diffuse disc bulge and reactive endplate changes at S5-K8. Moderate facet hypertrophy at L4-5 and L5-S1. No significant stenosis. IMPRESSION: CT THORACIC SPINE IMPRESSION 1. Acute burst type compression fracture involving the T12 vertebral body with up to 20% central height loss and 3 mm bony retropulsion. No significant stenosis. 2. No other acute traumatic injury within the thoracic spine. CT LUMBAR SPINE IMPRESSION 1. No acute traumatic injury within the lumbar spine. 2. Mild moderate degenerate spondylolysis and facet arthrosis at L4-5 and L5-S1 without significant stenosis. Electronically Signed   By: Jeannine Boga M.D.   On: 07/31/2018 22:24    ____________________________________________    PROCEDURES  Procedure(s) performed:    Procedures    Medications  sodium chloride 0.9 % bolus 500 mL (500 mLs Intravenous New Bag/Given 07/31/18 2243)  ondansetron (ZOFRAN) injection 4 mg (4 mg Intravenous Given 07/31/18 2242)  morphine 4 MG/ML injection 4 mg (4 mg Intravenous Given 07/31/18 2301)     ____________________________________________   INITIAL IMPRESSION / ASSESSMENT AND PLAN / ED COURSE  Pertinent labs & imaging results that were available during my care of the patient were reviewed by me and considered in my medical decision making (see chart for details).  Review of the [place] CSRS was performed in accordance of the [place] prior to dispensing any controlled drugs.           Assessment and plan Compression fracture 85 year old female presents to the emergency department after a mechanical non-syncopal fall that occurred around 11 AM this morning  On physical  exam, patient appeared hypertensive and uncomfortable.  She complained of nausea throughout exam.  Her neuro exam was reassuring with no facial droop or expressive aphasia.  She had symmetric grip strength and normal  sensation.  Differential diagnosis included subdural hematoma, subarachnoid hemorrhage, skull fracture, STEMI, fractures in the cervical, thoracic and lumbar spine...  CT head revealed no evidence of intracranial hemorrhage or skull fracture.  Troponin was within reference range.  CT of the thoracic spine revealed a grade 1 T12 compression fracture.  Patient's case was discussed with prime doc, Dr. Sidney Ace.  Patient was accepted for admission to help control pain and nausea.  Patient and family members were comfortable with patient staying in the hospital.  Patient was given morphine and 500 cc of normal saline in the emergency department. All patient questions were answered.      ____________________________________________  FINAL CLINICAL IMPRESSION(S) / ED DIAGNOSES  Final diagnoses:  Fall, initial encounter      NEW MEDICATIONS STARTED DURING THIS VISIT:  ED Discharge Orders    None          This chart was dictated using voice recognition software/Dragon. Despite best efforts to proofread, errors can occur which can change the meaning. Any change was purely unintentional.    Lannie Fields, PA-C 07/31/18 2308    Nance Pear, MD 08/01/18 [phone]

## 2018-07-31 NOTE — ED Triage Notes (Addendum)
Patient coming [facility] from home for mechanical fall. Patient c/o right knee pain and back pain post fall. Patient ambulated from stretcher to chair with 1 person assist.  Patient denies LOC, dizziness, weakness. Patient intialy reported some nausea due to pain post fall, EMS gave 4 mg zofran; nausea resolved.

## 2018-07-31 NOTE — ED Notes (Signed)
Pt given blankets and ice chips.

## 2018-07-31 NOTE — ED Notes (Signed)
ED TO INPATIENT HANDOFF REPORT  ED Nurse Name and Phone #:  Izzy [phone]  S Name/Age/Gender Wanda Baird 85 y.o. female Room/Bed: ED41A/ED41A  Code Status   Code Status: Not on file  Home/SNF/Other Home Patient oriented to: self, place, time and situation Is this baseline? Yes   Triage Complete: Triage complete  Chief Complaint fall and back pain ems  Triage Note Patient coming [facility] from home for mechanical fall. Patient c/o right knee pain and back pain post fall. Patient ambulated from stretcher to chair with 1 person assist.  Patient denies LOC, dizziness, weakness. Patient intialy reported some nausea due to pain post fall, EMS gave 4 mg zofran; nausea resolved.    Allergies Allergies  Allergen Reactions  . Codeine     REACTION: nausea and vomiting  . Percocet [Oxycodone-Acetaminophen] Nausea Only    Level of Care/Admitting Diagnosis ED Disposition    ED Disposition Condition [facility] Area: [place] [[id number]]  Level of Care: Med-Surg [16]  Covid Evaluation: Confirmed COVID Negative  Diagnosis: Fall [290176]  Admitting Physician: Christel Mormon [[id number]]  Attending Physician: Christel Mormon [[id number]]  Estimated length of stay: past midnight tomorrow  Certification:: I certify this patient will need inpatient services for at least 2 midnights  PT Class (Do Not Modify): Inpatient [101]  PT Acc Code (Do Not Modify): Private [1]       B Medical/Surgery History Past Medical History:  Diagnosis Date  . Diabetes mellitus without complication ([place])   . Hypertension    Past Surgical History:  Procedure Laterality Date  . BACK SURGERY       A IV Location/Drains/Wounds Patient Lines/Drains/Airways Status   Active Line/Drains/Airways    Name:   Placement date:   Placement time:   Site:   Days:   Peripheral IV 07/31/18 Wrist   07/31/18    2000    Wrist   less than 1          Intake/Output Last 24 hours No  intake or output data in the 24 hours ending 07/31/18 2309  Labs/Imaging Results for orders placed or performed during the hospital encounter of 07/31/18 (from the past 48 hour(s))  CBC with Differential     Status: Abnormal   Collection Time: 07/31/18  9:15 PM  Result Value Ref Range   WBC 12.3 (H) 4.0 - 10.5 K/uL   RBC 4.43 3.87 - 5.11 MIL/uL   Hemoglobin 13.6 12.0 - 15.0 g/dL   HCT 38.3 36.0 - 46.0 %   MCV 86.5 80.0 - 100.0 fL   MCH 30.7 26.0 - 34.0 pg   MCHC 35.5 30.0 - 36.0 g/dL   RDW 11.6 11.5 - 15.5 %   Platelets 185 150 - 400 K/uL   nRBC 0.0 0.0 - 0.2 %   Neutrophils Relative % 83 %   Neutro Abs 10.2 (H) 1.7 - 7.7 K/uL   Lymphocytes Relative 10 %   Lymphs Abs 1.3 0.7 - 4.0 K/uL   Monocytes Relative 6 %   Monocytes Absolute 0.8 0.1 - 1.0 K/uL   Eosinophils Relative 0 %   Eosinophils Absolute 0.0 0.0 - 0.5 K/uL   Basophils Relative 0 %   Basophils Absolute 0.0 0.0 - 0.1 K/uL   Immature Granulocytes 1 %   Abs Immature Granulocytes 0.09 (H) 0.00 - 0.07 K/uL    Comment: Performed at [facility], [address] [place], [place] [address]  Comprehensive metabolic  panel     Status: Abnormal   Collection Time: 07/31/18  9:15 PM  Result Value Ref Range   Sodium 130 (L) 135 - 145 mmol/L   Potassium 3.8 3.5 - 5.1 mmol/L   Chloride 95 (L) 98 - 111 mmol/L   CO2 23 22 - 32 mmol/L   Glucose, Bld 216 (H) 70 - 99 mg/dL   BUN 20 8 - 23 mg/dL   Creatinine, Ser 0.58 0.44 - 1.00 mg/dL   Calcium 9.0 8.9 - 10.3 mg/dL   Total Protein 6.9 6.5 - 8.1 g/dL   Albumin 4.4 3.5 - 5.0 g/dL   AST 26 15 - 41 U/L   ALT 22 0 - 44 U/L   Alkaline Phosphatase 56 38 - 126 U/L   Total Bilirubin 2.2 (H) 0.3 - 1.2 mg/dL   GFR calc non Af Amer >60 >60 mL/min   GFR calc Af Amer >60 >60 mL/min   Anion gap 12 5 - 15    Comment: Performed at [facility], [address] [place], [place] [address]  Lipase, blood     Status: None   Collection Time: 07/31/18  9:15 PM  Result Value Ref  Range   Lipase 29 11 - 51 U/L    Comment: Performed at [facility], [address] [place], [place] [address]  Troponin I - ONCE - STAT     Status: None   Collection Time: 07/31/18  9:15 PM  Result Value Ref Range   Troponin I <0.03 <0.03 ng/mL    Comment: Performed at [facility], [address], [place], [place] [address]   Dg Chest 1 View  Result Date: 07/31/2018 CLINICAL DATA:  Upper back pain following a fall. EXAM: CHEST  1 VIEW COMPARISON:  06/21/2006. FINDINGS: The heart remains grossly normal in size. The lungs are mildly hyperexpanded and clear. Diffuse osteopenia, mild left glenohumeral joint degenerative changes and moderate bilateral AC joint degenerative changes. Minimal scoliosis. No fracture or pneumothorax seen. IMPRESSION: No acute abnormality. Mild changes of COPD. Electronically Signed   By: Claudie Revering M.D.   On: 07/31/2018 21:59   Ct Head Wo Contrast  Result Date: 07/31/2018 CLINICAL DATA:  Fall with head trauma EXAM: CT HEAD WITHOUT CONTRAST CT CERVICAL SPINE WITHOUT CONTRAST TECHNIQUE: Multidetector CT imaging of the head and cervical spine was performed following the standard protocol without intravenous contrast. Multiplanar CT image reconstructions of the cervical spine were also generated. COMPARISON:  None. FINDINGS: CT HEAD FINDINGS Brain: There is no mass, hemorrhage or extra-axial collection. The size and configuration of the ventricles and extra-axial CSF spaces are normal. The brain parenchyma is normal, without evidence of acute or chronic infarction. Vascular: No abnormal hyperdensity of the major intracranial arteries or dural venous sinuses. No intracranial atherosclerosis. Skull: The visualized skull base, calvarium and extracranial soft tissues are normal. Sinuses/Orbits: No fluid levels or advanced mucosal thickening of the visualized paranasal sinuses. No mastoid or middle ear effusion. The orbits are normal. CT CERVICAL SPINE FINDINGS  Alignment: No static subluxation. Facets are aligned. Occipital condyles are normally positioned. Skull base and vertebrae: No acute fracture. Soft tissues and spinal canal: No prevertebral fluid or swelling. No visible canal hematoma. Disc levels: No advanced spinal canal or neural foraminal stenosis. Right C2-3 and C3-4 facet hypertrophy. Upper chest: No pneumothorax, pulmonary nodule or pleural effusion. Other: Normal visualized paraspinal cervical soft tissues. IMPRESSION: No acute abnormality of the head or cervical spine. Electronically Signed   By:  Ulyses Jarred M.D.   On: 07/31/2018 22:21   Ct Cervical Spine Wo Contrast  Result Date: 07/31/2018 CLINICAL DATA:  Fall with head trauma EXAM: CT HEAD WITHOUT CONTRAST CT CERVICAL SPINE WITHOUT CONTRAST TECHNIQUE: Multidetector CT imaging of the head and cervical spine was performed following the standard protocol without intravenous contrast. Multiplanar CT image reconstructions of the cervical spine were also generated. COMPARISON:  None. FINDINGS: CT HEAD FINDINGS Brain: There is no mass, hemorrhage or extra-axial collection. The size and configuration of the ventricles and extra-axial CSF spaces are normal. The brain parenchyma is normal, without evidence of acute or chronic infarction. Vascular: No abnormal hyperdensity of the major intracranial arteries or dural venous sinuses. No intracranial atherosclerosis. Skull: The visualized skull base, calvarium and extracranial soft tissues are normal. Sinuses/Orbits: No fluid levels or advanced mucosal thickening of the visualized paranasal sinuses. No mastoid or middle ear effusion. The orbits are normal. CT CERVICAL SPINE FINDINGS Alignment: No static subluxation. Facets are aligned. Occipital condyles are normally positioned. Skull base and vertebrae: No acute fracture. Soft tissues and spinal canal: No prevertebral fluid or swelling. No visible canal hematoma. Disc levels: No advanced spinal canal or neural  foraminal stenosis. Right C2-3 and C3-4 facet hypertrophy. Upper chest: No pneumothorax, pulmonary nodule or pleural effusion. Other: Normal visualized paraspinal cervical soft tissues. IMPRESSION: No acute abnormality of the head or cervical spine. Electronically Signed   By: Ulyses Jarred M.D.   On: 07/31/2018 22:21   Ct Thoracic Spine Wo Contrast  Result Date: 07/31/2018 CLINICAL DATA:  Initial evaluation for acute trauma, mechanical fall. Back pain. EXAM: CT THORACIC AND LUMBAR SPINE WITHOUT CONTRAST TECHNIQUE: Multidetector CT imaging of the thoracic and lumbar spine was performed without contrast. Multiplanar CT image reconstructions were also generated. COMPARISON:  None. FINDINGS: CT THORACIC SPINE FINDINGS Alignment: Vertebral bodies normally aligned with preservation of the normal thoracic kyphosis. No listhesis. Vertebrae: Acute burst type compression fracture involving the T12 vertebral body with mild 20% central height loss with 3 mm bony retropulsion. No significant stenosis. Vertebral body heights otherwise maintained without evidence for acute or chronic fracture. Few scattered sclerotic foci within the upper thoracic spine most consistent with benign bone islands. No other discrete or worrisome osseous lesions. Paraspinal and other soft tissues: Paraspinous soft tissues demonstrate no acute finding. Moderate aortic atherosclerosis. Prior cholecystectomy. Small hiatal hernia. Partially visualized lungs are grossly clear. Disc levels: Mild multilevel reactive endplate changes with disc desiccation present at T5-6, T6-7, T7-8, and T8-9. No significant stenosis within the thoracic spine. CT LUMBAR SPINE FINDINGS Segmentation: Standard. Lowest well-formed disc labeled the L5-S1 level. Alignment: Vertebral bodies normally aligned with preservation of the normal lumbar lordosis. No listhesis or malalignment. Vertebrae: Vertebral body heights maintained without evidence for acute or chronic fracture.  Visualized sacrum and pelvis intact. SI joints approximated symmetric. No discrete osseous lesions. Paraspinal and other soft tissues: Paraspinous soft tissues demonstrate no acute abnormality. Moderate aorto bi-iliac atherosclerotic disease without visible aneurysm. Disc levels: Degenerative disc desiccation with mild disc bulging at L4-5. Chronic intervertebral disc space narrowing with diffuse disc bulge and reactive endplate changes at V4-Q5. Moderate facet hypertrophy at L4-5 and L5-S1. No significant stenosis. IMPRESSION: CT THORACIC SPINE IMPRESSION 1. Acute burst type compression fracture involving the T12 vertebral body with up to 20% central height loss and 3 mm bony retropulsion. No significant stenosis. 2. No other acute traumatic injury within the thoracic spine. CT LUMBAR SPINE IMPRESSION 1. No acute traumatic injury within the lumbar  spine. 2. Mild moderate degenerate spondylolysis and facet arthrosis at L4-5 and L5-S1 without significant stenosis. Electronically Signed   By: Jeannine Boga M.D.   On: 07/31/2018 22:24   Ct Lumbar Spine Wo Contrast  Result Date: 07/31/2018 CLINICAL DATA:  Initial evaluation for acute trauma, mechanical fall. Back pain. EXAM: CT THORACIC AND LUMBAR SPINE WITHOUT CONTRAST TECHNIQUE: Multidetector CT imaging of the thoracic and lumbar spine was performed without contrast. Multiplanar CT image reconstructions were also generated. COMPARISON:  None. FINDINGS: CT THORACIC SPINE FINDINGS Alignment: Vertebral bodies normally aligned with preservation of the normal thoracic kyphosis. No listhesis. Vertebrae: Acute burst type compression fracture involving the T12 vertebral body with mild 20% central height loss with 3 mm bony retropulsion. No significant stenosis. Vertebral body heights otherwise maintained without evidence for acute or chronic fracture. Few scattered sclerotic foci within the upper thoracic spine most consistent with benign bone islands. No other  discrete or worrisome osseous lesions. Paraspinal and other soft tissues: Paraspinous soft tissues demonstrate no acute finding. Moderate aortic atherosclerosis. Prior cholecystectomy. Small hiatal hernia. Partially visualized lungs are grossly clear. Disc levels: Mild multilevel reactive endplate changes with disc desiccation present at T5-6, T6-7, T7-8, and T8-9. No significant stenosis within the thoracic spine. CT LUMBAR SPINE FINDINGS Segmentation: Standard. Lowest well-formed disc labeled the L5-S1 level. Alignment: Vertebral bodies normally aligned with preservation of the normal lumbar lordosis. No listhesis or malalignment. Vertebrae: Vertebral body heights maintained without evidence for acute or chronic fracture. Visualized sacrum and pelvis intact. SI joints approximated symmetric. No discrete osseous lesions. Paraspinal and other soft tissues: Paraspinous soft tissues demonstrate no acute abnormality. Moderate aorto bi-iliac atherosclerotic disease without visible aneurysm. Disc levels: Degenerative disc desiccation with mild disc bulging at L4-5. Chronic intervertebral disc space narrowing with diffuse disc bulge and reactive endplate changes at E3-X5. Moderate facet hypertrophy at L4-5 and L5-S1. No significant stenosis. IMPRESSION: CT THORACIC SPINE IMPRESSION 1. Acute burst type compression fracture involving the T12 vertebral body with up to 20% central height loss and 3 mm bony retropulsion. No significant stenosis. 2. No other acute traumatic injury within the thoracic spine. CT LUMBAR SPINE IMPRESSION 1. No acute traumatic injury within the lumbar spine. 2. Mild moderate degenerate spondylolysis and facet arthrosis at L4-5 and L5-S1 without significant stenosis. Electronically Signed   By: Jeannine Boga M.D.   On: 07/31/2018 22:24    Pending Labs Unresulted Labs (From admission, onward)    Start     Ordered   07/31/18 2118  Urinalysis, Complete w Microscopic  Once,   STAT      07/31/18 2118          Vitals/Pain Today's Vitals   07/31/18 2031 07/31/18 2035 07/31/18 2304  BP:  (!) 179/65 (!) 152/68  Pulse:  87 80  Resp:  16 14  Temp:  98.3 F (36.8 C) 98.5 F (36.9 C)  TempSrc:   Oral  SpO2:  95% 93%  Weight: 70 kg    PainSc:  6      Isolation Precautions No active isolations  Medications Medications  sodium chloride 0.9 % bolus 500 mL (500 mLs Intravenous New Bag/Given 07/31/18 2243)  ondansetron (ZOFRAN) injection 4 mg (4 mg Intravenous Given 07/31/18 2242)  morphine 4 MG/ML injection 4 mg (4 mg Intravenous Given 07/31/18 2301)    Mobility walks Low fall risk   Focused Assessments .   R Recommendations: See Admitting Provider Note  Report given to:   Additional Notes: .

## 2018-08-01 ENCOUNTER — Other Ambulatory Visit: Payer: Self-pay

## 2018-08-01 ENCOUNTER — Observation Stay: Payer: Medicare Other

## 2018-08-01 DIAGNOSIS — M4854XA Collapsed vertebra, not elsewhere classified, thoracic region, initial encounter for fracture: Secondary | ICD-10-CM | POA: Diagnosis not present

## 2018-08-01 LAB — URINALYSIS, ROUTINE W REFLEX MICROSCOPIC
Bilirubin Urine: NEGATIVE
Glucose, UA: NEGATIVE mg/dL
Hgb urine dipstick: NEGATIVE
Ketones, ur: NEGATIVE mg/dL
Leukocytes,Ua: NEGATIVE
Nitrite: NEGATIVE
Protein, ur: NEGATIVE mg/dL
Specific Gravity, Urine: 1.004 — ABNORMAL LOW (ref 1.005–1.030)
pH: 6 (ref 5.0–8.0)

## 2018-08-01 LAB — CBC
HCT: 36.3 % (ref 36.0–46.0)
Hemoglobin: 12.5 g/dL (ref 12.0–15.0)
MCH: 30 pg (ref 26.0–34.0)
MCHC: 34.4 g/dL (ref 30.0–36.0)
MCV: 87.1 fL (ref 80.0–100.0)
Platelets: 153 10*3/uL (ref 150–400)
RBC: 4.17 MIL/uL (ref 3.87–5.11)
RDW: 11.7 % (ref 11.5–15.5)
WBC: 9.8 10*3/uL (ref 4.0–10.5)
nRBC: 0 % (ref 0.0–0.2)

## 2018-08-01 LAB — BASIC METABOLIC PANEL
Anion gap: 8 (ref 5–15)
BUN: 15 mg/dL (ref 8–23)
CO2: 26 mmol/L (ref 22–32)
Calcium: 8.8 mg/dL — ABNORMAL LOW (ref 8.9–10.3)
Chloride: 101 mmol/L (ref 98–111)
Creatinine, Ser: 0.59 mg/dL (ref 0.44–1.00)
GFR calc Af Amer: >60 mL/min (ref >60)
GFR calc non Af Amer: >60 mL/min (ref >60)
Glucose, Bld: 175 mg/dL — ABNORMAL HIGH (ref 70–99)
Potassium: 3.6 mmol/L (ref 3.5–5.1)
Sodium: 135 mmol/L (ref 135–145)

## 2018-08-01 LAB — SARS CORONAVIRUS 2 BY RT PCR (HOSPITAL ORDER, PERFORMED IN ~~LOC~~ HOSPITAL LAB): SARS Coronavirus 2: NEGATIVE

## 2018-08-01 LAB — GLUCOSE, CAPILLARY
Glucose-Capillary: 171 mg/dL — ABNORMAL HIGH (ref 70–99)
Glucose-Capillary: 166 mg/dL — ABNORMAL HIGH (ref 70–99)
Glucose-Capillary: 173 mg/dL — ABNORMAL HIGH (ref 70–99)
Glucose-Capillary: 157 mg/dL — ABNORMAL HIGH (ref 70–99)
Glucose-Capillary: 166 mg/dL — ABNORMAL HIGH (ref 70–99)

## 2018-08-01 MED ORDER — POLYETHYLENE GLYCOL 3350 17 G PO PACK: 17.0000 g | PACK | Freq: Every day | ORAL | Status: DC | PRN

## 2018-08-01 MED ORDER — SODIUM CHLORIDE 0.9% FLUSH
3.0000 mL | Freq: Two times a day (BID) | INTRAVENOUS | Status: DC
  Administered 2018-08-01 (×2): 3 mL via INTRAVENOUS

## 2018-08-01 MED ORDER — METOCLOPRAMIDE HCL 5 MG/ML IJ SOLN
10.0000 mg | Freq: Four times a day (QID) | INTRAMUSCULAR | Status: DC | PRN
  Administered 2018-08-01 – 2018-08-02 (×3): 10 mg via INTRAVENOUS
  Filled 2018-08-01 (×3): qty 2

## 2018-08-01 MED ORDER — PANTOPRAZOLE SODIUM 40 MG PO TBEC
40.0000 mg | DELAYED_RELEASE_TABLET | Freq: Every day | ORAL | Status: DC
  Administered 2018-08-01 – 2018-08-02 (×2): 40 mg via ORAL
  Filled 2018-08-01 (×2): qty 1

## 2018-08-01 MED ORDER — ONDANSETRON HCL 4 MG PO TABS: 4.0000 mg | ORAL_TABLET | Freq: Four times a day (QID) | ORAL | Status: DC | PRN

## 2018-08-01 MED ORDER — SODIUM CHLORIDE 0.9 % IV SOLN
INTRAVENOUS | Status: DC
  Administered 2018-08-01: 03:00:00 via INTRAVENOUS

## 2018-08-01 MED ORDER — ADULT MULTIVITAMIN W/MINERALS CH
1.0000 | ORAL_TABLET | Freq: Every day | ORAL | Status: DC
  Filled 2018-08-01: qty 1

## 2018-08-01 MED ORDER — SODIUM CHLORIDE 0.9 % IV SOLN
INTRAVENOUS | Status: DC
  Administered 2018-08-01 – 2018-08-02 (×2): via INTRAVENOUS

## 2018-08-01 MED ORDER — INSULIN ASPART 100 UNIT/ML ~~LOC~~ SOLN: 0.0000 [IU] | Freq: Three times a day (TID) | SUBCUTANEOUS | Status: DC

## 2018-08-01 MED ORDER — TRAMADOL HCL 50 MG PO TABS: 50.0000 mg | ORAL_TABLET | Freq: Four times a day (QID) | ORAL | 0 refills | Status: DC | PRN

## 2018-08-01 MED ORDER — INSULIN ASPART 100 UNIT/ML ~~LOC~~ SOLN: 0.0000 [IU] | Freq: Every day | SUBCUTANEOUS | Status: DC

## 2018-08-01 MED ORDER — ENOXAPARIN SODIUM 40 MG/0.4ML ~~LOC~~ SOLN
40.0000 mg | SUBCUTANEOUS | Status: DC
  Administered 2018-08-01 – 2018-08-02 (×2): 40 mg via SUBCUTANEOUS
  Filled 2018-08-01 (×2): qty 0.4

## 2018-08-01 MED ORDER — TRAMADOL HCL 50 MG PO TABS: 50.0000 mg | ORAL_TABLET | Freq: Four times a day (QID) | ORAL | Status: DC | PRN

## 2018-08-01 MED ORDER — ACETAMINOPHEN 650 MG RE SUPP: 650.0000 mg | Freq: Four times a day (QID) | RECTAL | Status: DC | PRN

## 2018-08-01 MED ORDER — ONDANSETRON HCL 4 MG/2ML IJ SOLN
4.0000 mg | Freq: Four times a day (QID) | INTRAMUSCULAR | Status: DC | PRN
  Administered 2018-08-01 (×2): 4 mg via INTRAVENOUS
  Filled 2018-08-01 (×2): qty 2

## 2018-08-01 MED ORDER — ACETAMINOPHEN 325 MG PO TABS
650.0000 mg | ORAL_TABLET | Freq: Four times a day (QID) | ORAL | Status: DC | PRN
  Administered 2018-08-01 – 2018-08-02 (×6): 650 mg via ORAL
  Filled 2018-08-01 (×6): qty 2

## 2018-08-01 NOTE — Evaluation (Signed)
Physical Therapy Evaluation Patient Details Name: Wanda Baird MRN: [id number] DOB: 05-08-1927 Today's Date: 08/01/2018   History of Present Illness  active 85 y/o who fell while cleaning her deck, apparently 3.5 hours, and suffered fall with with T12 compression fx.  Clinical Impression  Pt did well with PT exam and was able to circumambulate the nurses' station, she was able to walk w/o AD but did better and was safer with RW, PT recommending.  Pt to follow up with her prior surgeon re: possible kypho or other intervention with T12 compression fx.  Pt will be safe to manage at home, reports son is able to help out with errands and work around the home.    Follow Up Recommendations No PT follow up    Equipment Recommendations  Rolling walker with 5" wheels    Recommendations for Other Services       Precautions / Restrictions Precautions Precautions: Fall Restrictions Weight Bearing Restrictions: No      Mobility  Bed Mobility Overal bed mobility: Modified Independent             General bed mobility comments: Pt was able to maintain neutral spine in getting to EOB, though she did need bed rails  Transfers Overall transfer level: Independent Equipment used: Rolling walker (2 wheeled)             General transfer comment: Pt was able to rise w/o RW, but was more confident and stable with it  Ambulation/Gait Ambulation/Gait assistance: Supervision Gait Distance (Feet): 200 Feet Assistive device: Rolling walker (2 wheeled);None       General Gait Details: ~125 ft with RW and ~75 ft w/o AD.  She normally does not need AD and was safe w/o it, but had more consistent and confident cadence with it and reported some mild pain relief while using it   Stairs            Wheelchair Mobility    Modified Rankin (Stroke Patients Only)       Balance Overall balance assessment: Modified Independent                                            Pertinent Vitals/Pain Pain Assessment: 0-10 Pain Score: 4  Pain Location: mid/low back    Home Living Family/patient expects to be discharged to:: Private residence Living Arrangements: Alone   Type of Home: House                Prior Function Level of Independence: Independent               Hand Dominance        Extremity/Trunk Assessment   Upper Extremity Assessment Upper Extremity Assessment: Overall WFL for tasks assessed    Lower Extremity Assessment Lower Extremity Assessment: Overall WFL for tasks assessed       Communication   Communication: No difficulties  Cognition Arousal/Alertness: Awake/alert Behavior During Therapy: WFL for tasks assessed/performed Overall Cognitive Status: Within Functional Limits for tasks assessed                                        General Comments      Exercises     Assessment/Plan    PT Assessment Patent does not need any further  PT services  PT Problem List         PT Treatment Interventions      PT Goals (Current goals can be found in the Care Plan section)  Acute Rehab PT Goals Patient Stated Goal: go home PT Goal Formulation: All assessment and education complete, DC therapy    Frequency     Barriers to discharge        Co-evaluation               AM-PAC PT "6 Clicks" Mobility  Outcome Measure Help needed turning from your back to your side while in a flat bed without using bedrails?: None Help needed moving from lying on your back to sitting on the side of a flat bed without using bedrails?: A Little Help needed moving to and from a bed to a chair (including a wheelchair)?: None Help needed standing up from a chair using your arms (e.g., wheelchair or bedside chair)?: None Help needed to walk in hospital room?: None Help needed climbing 3-5 steps with a railing? : None 6 Click Score: 23    End of Session Equipment Utilized During Treatment: Gait  belt Activity Tolerance: Patient tolerated treatment well Patient left: with bed alarm set;with call bell/phone within reach Nurse Communication: Mobility status PT Visit Diagnosis: Muscle weakness (generalized) (M62.81);Difficulty in walking, not elsewhere classified (R26.2)    Time: 8916-9450 PT Time Calculation (min) (ACUTE ONLY): 16 min   Charges:   PT Evaluation $PT Eval Low Complexity: 1 Low          Kreg Shropshire, DPT 08/01/2018, 2:45 PM

## 2018-08-01 NOTE — H&P (Signed)
Attending physician admission note:  I have seen and examined the patient with Ms. Gardiner Barefoot, CRNP.  The patient presents with acute onset of accidental fall with subsequent T12 compression fracture and back pain.  No headache or dizziness or blurred vision.  No paresthesias or focal muscle weakness in either upper or lower extremities.  Upon physical examination:  Generally: Pleasant elderly Caucasian female in no acute distress. Cardiovascular: Regular rate and rhythm with normal S1-S2 and no murmurs gallops or rubs. Respiratory: Clear to auscultation bilaterally Abdomen: Soft, nontender, nondistended with positive bowel sounds and no palpable organomegaly or masses. Musculoskeletal: Mid back tenderness at T12 level. Extremities: No edema clubbing or cyanosis  Labs and radiographic studies: were all reviewed  Assessment/plan: Mechanical fall with subsequent T12 compression fracture.  The patient will be admitted to a medical bed.  Pain management will be provided.  Orthopedic evaluation was requested.  For further details please refer to dictated admission H&P.  I have discussed the case with my nurse practitioner. I agree with the admission note and the rest of the plan of care as delineated by Ms. Gardiner Barefoot, CRNP.

## 2018-08-01 NOTE — Care Management Obs Status (Signed)
[facility] NOTIFICATION   Patient Details  Name: Wanda Baird MRN: [id number] Date of Birth: 15-May-1927   Medicare Observation Status Notification Given:  Yes    Beverly Sessions, RN 08/01/2018, 2:33 PM

## 2018-08-01 NOTE — H&P (Signed)
[facility] NAME: Wanda Baird    MR#:  [id number]  DATE OF BIRTH:  1927-05-01  DATE OF ADMISSION:  07/31/2018  PRIMARY CARE PHYSICIAN: Derinda Late, MD   REQUESTING/REFERRING PHYSICIAN: Skeet Simmer, physician assistant  CHIEF COMPLAINT:   Chief Complaint  Patient presents with   Fall    HISTORY OF PRESENT ILLNESS:  Wanda Baird  is a 85 y.o. female with a known history of hypertension and diabetes mellitus.  She presented to the emergency room later in the evening after falling in her home on the hardwood flooring around 11 AM on 07/31/2018.  Patient had been working outside from 6 30-10 30 washing off her deck.  When she walked in her home, she slipped when taking off her shoes falling onto her back.  Patient does not recall hitting her head.  She does not recall loss of consciousness.  She denies blurred vision or diplopia.  She has experienced increasing pain in her mid to upper back since the accident which is now more painful upon standing.  She denies paresthesias in her upper or lower extremities.  She is moving all extremities well.  She has urinated since the fall with no dysuria or urinary incontinence.  She denies noting hematuria.  She has not experienced fevers or chills.  She denies recent illness.  She denies chest pain or shortness of breath.  Denies cough.  She denies dizziness or weakness at this time.  Thoracic CT demonstrated acute burst type compression fracture involving the T12 vertebral body with up to 20% central height loss and 3 mm bony retropulsion. No significant stenosis.  CT head and cervical spine demonstrated no acute abnormalities.  X-ray showed no acute pulmonary disease  We have admitted her to the hospitalist service for further management.  PAST MEDICAL HISTORY:   Past Medical History:  Diagnosis Date   Diabetes mellitus without complication ([place])    Hypertension     PAST  SURGICAL HISTORY:   Past Surgical History:  Procedure Laterality Date   BACK SURGERY      SOCIAL HISTORY:   Social History   Tobacco Use   Smoking status: Never Smoker   Smokeless tobacco: Never Used  Substance Use Topics   Alcohol use: No    FAMILY HISTORY:   Family History  Problem Relation Age of Onset   Breast cancer Maternal Aunt     DRUG ALLERGIES:   Allergies  Allergen Reactions   Codeine     REACTION: nausea and vomiting   Percocet [Oxycodone-Acetaminophen] Nausea Only    REVIEW OF SYSTEMS:   Review of Systems  Constitutional: Negative for chills, fever and malaise/fatigue.  HENT: Negative for congestion, nosebleeds, sinus pain and sore throat.   Eyes: Negative for blurred vision, double vision and pain.  Respiratory: Negative for cough, sputum production and shortness of breath.   Cardiovascular: Negative for chest pain, palpitations, claudication, leg swelling and PND.  Gastrointestinal: Positive for nausea. Negative for abdominal pain, blood in stool, constipation, diarrhea, heartburn and vomiting.  Genitourinary: Negative for dysuria, flank pain, frequency, hematuria and urgency.  Musculoskeletal: Positive for back pain (mid to upper) and falls (fell today in her home).  Skin: Negative for itching and rash.  Neurological: Negative for dizziness, tingling, tremors, sensory change, focal weakness, seizures, loss of consciousness, weakness and headaches.  Psychiatric/Behavioral: Negative.      MEDICATIONS AT HOME:   Prior to Admission medications   Medication  Sig Start Date End Date Taking? Authorizing Provider  ondansetron (ZOFRAN ODT) 4 MG disintegrating tablet Take 1 tablet (4 mg total) by mouth every 6 (six) hours as needed for nausea or vomiting. 07/30/15   Delman Kitten, MD  ondansetron (ZOFRAN) 4 MG tablet Take 1-2 tabs by mouth every 8 hours as needed for nausea/vomiting 11/20/14   Hinda Kehr, MD      VITAL SIGNS:  Blood pressure  140/69, pulse 76, temperature 97.7 F (36.5 C), temperature source Oral, resp. rate 16, weight 70 kg, SpO2 97 %.  PHYSICAL EXAMINATION:  Physical Exam Constitutional:      General: She is not in acute distress.    Appearance: Normal appearance. She is normal weight. She is not ill-appearing.  HENT:     Head: Normocephalic and atraumatic.     Right Ear: External ear normal.     Left Ear: External ear normal.     Nose: Nose normal. No congestion.     Mouth/Throat:     Mouth: Mucous membranes are moist.     Pharynx: Oropharynx is clear.  Eyes:     General: No scleral icterus.    Extraocular Movements: Extraocular movements intact.     Conjunctiva/sclera: Conjunctivae normal.     Pupils: Pupils are equal, round, and reactive to light.  Neck:     Musculoskeletal: Normal range of motion and neck supple. No muscular tenderness.  Cardiovascular:     Rate and Rhythm: Normal rate and regular rhythm.     Pulses: Normal pulses.     Heart sounds: Normal heart sounds. No murmur. No friction rub. No gallop.   Pulmonary:     Effort: Pulmonary effort is normal. No respiratory distress.     Breath sounds: Normal breath sounds. No wheezing, rhonchi or rales.  Chest:     Chest wall: No tenderness.  Abdominal:     General: Abdomen is flat. Bowel sounds are normal. There is no distension.     Palpations: Abdomen is soft. There is no mass.     Tenderness: There is no abdominal tenderness. There is no right CVA tenderness, left CVA tenderness, guarding or rebound.  Musculoskeletal: Normal range of motion.        General: No swelling or tenderness.     Right lower leg: No edema.     Left lower leg: No edema.  Skin:    General: Skin is warm and dry.     Capillary Refill: Capillary refill takes less than 2 seconds.     Findings: No rash.  Neurological:     General: No focal deficit present.     Mental Status: She is alert and oriented to person, place, and time.     Sensory: No sensory deficit.       Motor: No weakness.  Psychiatric:        Mood and Affect: Mood normal.        Behavior: Behavior normal.       LABORATORY PANEL:   CBC Recent Labs  Lab 07/31/18 2115  WBC 12.3*  HGB 13.6  HCT 38.3  PLT 185   ------------------------------------------------------------------------------------------------------------------  Chemistries  Recent Labs  Lab 07/31/18 2115  NA 130*  K 3.8  CL 95*  CO2 23  GLUCOSE 216*  BUN 20  CREATININE 0.58  CALCIUM 9.0  AST 26  ALT 22  ALKPHOS 56  BILITOT 2.2*   ------------------------------------------------------------------------------------------------------------------  Cardiac Enzymes Recent Labs  Lab 07/31/18 2115  TROPONINI <0.03   ------------------------------------------------------------------------------------------------------------------  RADIOLOGY:  Dg Chest 1 View  Result Date: 07/31/2018 CLINICAL DATA:  Upper back pain following a fall. EXAM: CHEST  1 VIEW COMPARISON:  06/21/2006. FINDINGS: The heart remains grossly normal in size. The lungs are mildly hyperexpanded and clear. Diffuse osteopenia, mild left glenohumeral joint degenerative changes and moderate bilateral AC joint degenerative changes. Minimal scoliosis. No fracture or pneumothorax seen. IMPRESSION: No acute abnormality. Mild changes of COPD. Electronically Signed   By: Claudie Revering M.D.   On: 07/31/2018 21:59   Ct Head Wo Contrast  Result Date: 07/31/2018 CLINICAL DATA:  Fall with head trauma EXAM: CT HEAD WITHOUT CONTRAST CT CERVICAL SPINE WITHOUT CONTRAST TECHNIQUE: Multidetector CT imaging of the head and cervical spine was performed following the standard protocol without intravenous contrast. Multiplanar CT image reconstructions of the cervical spine were also generated. COMPARISON:  None. FINDINGS: CT HEAD FINDINGS Brain: There is no mass, hemorrhage or extra-axial collection. The size and configuration of the ventricles and extra-axial CSF  spaces are normal. The brain parenchyma is normal, without evidence of acute or chronic infarction. Vascular: No abnormal hyperdensity of the major intracranial arteries or dural venous sinuses. No intracranial atherosclerosis. Skull: The visualized skull base, calvarium and extracranial soft tissues are normal. Sinuses/Orbits: No fluid levels or advanced mucosal thickening of the visualized paranasal sinuses. No mastoid or middle ear effusion. The orbits are normal. CT CERVICAL SPINE FINDINGS Alignment: No static subluxation. Facets are aligned. Occipital condyles are normally positioned. Skull base and vertebrae: No acute fracture. Soft tissues and spinal canal: No prevertebral fluid or swelling. No visible canal hematoma. Disc levels: No advanced spinal canal or neural foraminal stenosis. Right C2-3 and C3-4 facet hypertrophy. Upper chest: No pneumothorax, pulmonary nodule or pleural effusion. Other: Normal visualized paraspinal cervical soft tissues. IMPRESSION: No acute abnormality of the head or cervical spine. Electronically Signed   By: Ulyses Jarred M.D.   On: 07/31/2018 22:21   Ct Cervical Spine Wo Contrast  Result Date: 07/31/2018 CLINICAL DATA:  Fall with head trauma EXAM: CT HEAD WITHOUT CONTRAST CT CERVICAL SPINE WITHOUT CONTRAST TECHNIQUE: Multidetector CT imaging of the head and cervical spine was performed following the standard protocol without intravenous contrast. Multiplanar CT image reconstructions of the cervical spine were also generated. COMPARISON:  None. FINDINGS: CT HEAD FINDINGS Brain: There is no mass, hemorrhage or extra-axial collection. The size and configuration of the ventricles and extra-axial CSF spaces are normal. The brain parenchyma is normal, without evidence of acute or chronic infarction. Vascular: No abnormal hyperdensity of the major intracranial arteries or dural venous sinuses. No intracranial atherosclerosis. Skull: The visualized skull base, calvarium and  extracranial soft tissues are normal. Sinuses/Orbits: No fluid levels or advanced mucosal thickening of the visualized paranasal sinuses. No mastoid or middle ear effusion. The orbits are normal. CT CERVICAL SPINE FINDINGS Alignment: No static subluxation. Facets are aligned. Occipital condyles are normally positioned. Skull base and vertebrae: No acute fracture. Soft tissues and spinal canal: No prevertebral fluid or swelling. No visible canal hematoma. Disc levels: No advanced spinal canal or neural foraminal stenosis. Right C2-3 and C3-4 facet hypertrophy. Upper chest: No pneumothorax, pulmonary nodule or pleural effusion. Other: Normal visualized paraspinal cervical soft tissues. IMPRESSION: No acute abnormality of the head or cervical spine. Electronically Signed   By: Ulyses Jarred M.D.   On: 07/31/2018 22:21   Ct Thoracic Spine Wo Contrast  Result Date: 07/31/2018 CLINICAL DATA:  Initial evaluation for acute trauma, mechanical fall. Back pain. EXAM: CT THORACIC AND  LUMBAR SPINE WITHOUT CONTRAST TECHNIQUE: Multidetector CT imaging of the thoracic and lumbar spine was performed without contrast. Multiplanar CT image reconstructions were also generated. COMPARISON:  None. FINDINGS: CT THORACIC SPINE FINDINGS Alignment: Vertebral bodies normally aligned with preservation of the normal thoracic kyphosis. No listhesis. Vertebrae: Acute burst type compression fracture involving the T12 vertebral body with mild 20% central height loss with 3 mm bony retropulsion. No significant stenosis. Vertebral body heights otherwise maintained without evidence for acute or chronic fracture. Few scattered sclerotic foci within the upper thoracic spine most consistent with benign bone islands. No other discrete or worrisome osseous lesions. Paraspinal and other soft tissues: Paraspinous soft tissues demonstrate no acute finding. Moderate aortic atherosclerosis. Prior cholecystectomy. Small hiatal hernia. Partially visualized  lungs are grossly clear. Disc levels: Mild multilevel reactive endplate changes with disc desiccation present at T5-6, T6-7, T7-8, and T8-9. No significant stenosis within the thoracic spine. CT LUMBAR SPINE FINDINGS Segmentation: Standard. Lowest well-formed disc labeled the L5-S1 level. Alignment: Vertebral bodies normally aligned with preservation of the normal lumbar lordosis. No listhesis or malalignment. Vertebrae: Vertebral body heights maintained without evidence for acute or chronic fracture. Visualized sacrum and pelvis intact. SI joints approximated symmetric. No discrete osseous lesions. Paraspinal and other soft tissues: Paraspinous soft tissues demonstrate no acute abnormality. Moderate aorto bi-iliac atherosclerotic disease without visible aneurysm. Disc levels: Degenerative disc desiccation with mild disc bulging at L4-5. Chronic intervertebral disc space narrowing with diffuse disc bulge and reactive endplate changes at Y8-M5. Moderate facet hypertrophy at L4-5 and L5-S1. No significant stenosis. IMPRESSION: CT THORACIC SPINE IMPRESSION 1. Acute burst type compression fracture involving the T12 vertebral body with up to 20% central height loss and 3 mm bony retropulsion. No significant stenosis. 2. No other acute traumatic injury within the thoracic spine. CT LUMBAR SPINE IMPRESSION 1. No acute traumatic injury within the lumbar spine. 2. Mild moderate degenerate spondylolysis and facet arthrosis at L4-5 and L5-S1 without significant stenosis. Electronically Signed   By: Jeannine Boga M.D.   On: 07/31/2018 22:24   Ct Lumbar Spine Wo Contrast  Result Date: 07/31/2018 CLINICAL DATA:  Initial evaluation for acute trauma, mechanical fall. Back pain. EXAM: CT THORACIC AND LUMBAR SPINE WITHOUT CONTRAST TECHNIQUE: Multidetector CT imaging of the thoracic and lumbar spine was performed without contrast. Multiplanar CT image reconstructions were also generated. COMPARISON:  None. FINDINGS: CT  THORACIC SPINE FINDINGS Alignment: Vertebral bodies normally aligned with preservation of the normal thoracic kyphosis. No listhesis. Vertebrae: Acute burst type compression fracture involving the T12 vertebral body with mild 20% central height loss with 3 mm bony retropulsion. No significant stenosis. Vertebral body heights otherwise maintained without evidence for acute or chronic fracture. Few scattered sclerotic foci within the upper thoracic spine most consistent with benign bone islands. No other discrete or worrisome osseous lesions. Paraspinal and other soft tissues: Paraspinous soft tissues demonstrate no acute finding. Moderate aortic atherosclerosis. Prior cholecystectomy. Small hiatal hernia. Partially visualized lungs are grossly clear. Disc levels: Mild multilevel reactive endplate changes with disc desiccation present at T5-6, T6-7, T7-8, and T8-9. No significant stenosis within the thoracic spine. CT LUMBAR SPINE FINDINGS Segmentation: Standard. Lowest well-formed disc labeled the L5-S1 level. Alignment: Vertebral bodies normally aligned with preservation of the normal lumbar lordosis. No listhesis or malalignment. Vertebrae: Vertebral body heights maintained without evidence for acute or chronic fracture. Visualized sacrum and pelvis intact. SI joints approximated symmetric. No discrete osseous lesions. Paraspinal and other soft tissues: Paraspinous soft tissues demonstrate no acute abnormality.  Moderate aorto bi-iliac atherosclerotic disease without visible aneurysm. Disc levels: Degenerative disc desiccation with mild disc bulging at L4-5. Chronic intervertebral disc space narrowing with diffuse disc bulge and reactive endplate changes at L3-Y1. Moderate facet hypertrophy at L4-5 and L5-S1. No significant stenosis. IMPRESSION: CT THORACIC SPINE IMPRESSION 1. Acute burst type compression fracture involving the T12 vertebral body with up to 20% central height loss and 3 mm bony retropulsion. No  significant stenosis. 2. No other acute traumatic injury within the thoracic spine. CT LUMBAR SPINE IMPRESSION 1. No acute traumatic injury within the lumbar spine. 2. Mild moderate degenerate spondylolysis and facet arthrosis at L4-5 and L5-S1 without significant stenosis. Electronically Signed   By: Jeannine Boga M.D.   On: 07/31/2018 22:24      IMPRESSION AND PLAN:  1. follow-up with T12 compression fracture - No numbness or paresthesias noted.  Continued upper back pain with standing.  Will control pain with analgesic --Orthopedic surgeon consulted, Dr. Marry Guan, for further evaluation and recommendations - Physical therapy consulted for supportive care  2.  Intractable nausea - We will treat with IV antiemetic - Urinalysis is pending  3. dehydration - Patient received 1 L IV fluid bolus in the emergency room.  We will continue normal saline at 75 cc/h. -We will repeat CBC and BMP in the a.m. -Telemetry monitoring  4.  Diabetes mellitus - Moderate sliding scale insulin  5.  Hypertension -We will treat persistent hypertension expectantly  DVT and PPI prophylaxis initiated   All the records are reviewed and case discussed with ED provider. The plan of care was discussed in details with the patient (and family). I answered all questions. The patient agreed to proceed with the above mentioned plan. Further management will depend upon hospital course.   CODE STATUS: Full code  TOTAL TIME TAKING CARE OF THIS PATIENT: 45 minutes.    [place] on 08/01/2018 at 1:03 AM  Pager - [phone]  After 6pm go to [email or web address] - Proofreader  [facility]  Office  [phone]  CC: Primary care physician; Derinda Late, MD   Note: This dictation was prepared with Dragon dictation along with smaller phrase technology. Any transcriptional errors that result from this process are unintentional.

## 2018-08-01 NOTE — Care Management CC44 (Signed)
Condition Code 44 Documentation Completed  Patient Details  Name: Wanda Baird MRN: [id number] Date of Birth: February 08, 1928   Condition Code 44 given:  Yes Patient signature on Condition Code 44 notice:  Yes Documentation of 2 MD's agreement:  Yes Code 44 added to claim:  Yes    Beverly Sessions, RN 08/01/2018, 2:35 PM

## 2018-08-01 NOTE — Progress Notes (Signed)
[facility] NAME: Wanda Baird    MR#:  [id number]  DATE OF BIRTH:  1927/04/11  SUBJECTIVE:  CHIEF COMPLAINT:   Chief Complaint  Patient presents with   Fall   Patient has better back pain but still has nausea. REVIEW OF SYSTEMS:  Review of Systems  Constitutional: Negative for chills, fever and malaise/fatigue.  HENT: Negative for sore throat.   Eyes: Negative for blurred vision and double vision.  Respiratory: Negative for cough, hemoptysis, shortness of breath, wheezing and stridor.   Cardiovascular: Negative for chest pain, palpitations, orthopnea and leg swelling.  Gastrointestinal: Positive for nausea. Negative for abdominal pain, blood in stool, diarrhea, melena and vomiting.  Genitourinary: Negative for dysuria, flank pain and hematuria.  Musculoskeletal: Positive for back pain. Negative for joint pain.  Skin: Negative for rash.  Neurological: Negative for dizziness, sensory change, focal weakness, seizures, loss of consciousness, weakness and headaches.  Endo/Heme/Allergies: Negative for polydipsia.  Psychiatric/Behavioral: Negative for depression. The patient is not nervous/anxious.     DRUG ALLERGIES:   Allergies  Allergen Reactions   Metformin Diarrhea   Codeine     REACTION: nausea and vomiting   Percocet [Oxycodone-Acetaminophen] Nausea Only   VITALS:  Blood pressure (!) 155/72, pulse 78, temperature 98.9 F (37.2 C), temperature source Oral, resp. rate 20, height 5\' 4"  (1.626 m), weight 69.6 kg, SpO2 96 %. PHYSICAL EXAMINATION:  Physical Exam Constitutional:      General: She is not in acute distress. HENT:     Head: Normocephalic.     Mouth/Throat:     Mouth: Mucous membranes are moist.  Eyes:     General: No scleral icterus.    Conjunctiva/sclera: Conjunctivae normal.     Pupils: Pupils are equal, round, and reactive to light.  Neck:     Musculoskeletal: Normal range of motion and neck  supple.     Vascular: No JVD.     Trachea: No tracheal deviation.  Cardiovascular:     Rate and Rhythm: Normal rate and regular rhythm.     Heart sounds: Normal heart sounds. No murmur. No gallop.   Pulmonary:     Effort: Pulmonary effort is normal. No respiratory distress.     Breath sounds: Normal breath sounds. No wheezing or rales.  Abdominal:     General: Bowel sounds are normal. There is no distension.     Palpations: Abdomen is soft.     Tenderness: There is no abdominal tenderness. There is no rebound.  Musculoskeletal: Normal range of motion.        General: No tenderness.     Right lower leg: No edema.     Left lower leg: No edema.  Skin:    Findings: No erythema or rash.  Neurological:     General: No focal deficit present.     Mental Status: She is alert and oriented to person, place, and time.     Cranial Nerves: No cranial nerve deficit.  Psychiatric:        Mood and Affect: Mood normal.    LABORATORY PANEL:  Female CBC Recent Labs  Lab 08/01/18 0353  WBC 9.8  HGB 12.5  HCT 36.3  PLT 153   ------------------------------------------------------------------------------------------------------------------ Chemistries  Recent Labs  Lab 07/31/18 2115 08/01/18 0353  NA 130* 135  K 3.8 3.6  CL 95* 101  CO2 23 26  GLUCOSE 216* 175*  BUN 20 15  CREATININE 0.58 0.59  CALCIUM 9.0  8.8*  AST 26  --   ALT 22  --   ALKPHOS 56  --   BILITOT 2.2*  --    RADIOLOGY:  Dg Chest 1 View  Result Date: 07/31/2018 CLINICAL DATA:  Upper back pain following a fall. EXAM: CHEST  1 VIEW COMPARISON:  06/21/2006. FINDINGS: The heart remains grossly normal in size. The lungs are mildly hyperexpanded and clear. Diffuse osteopenia, mild left glenohumeral joint degenerative changes and moderate bilateral AC joint degenerative changes. Minimal scoliosis. No fracture or pneumothorax seen. IMPRESSION: No acute abnormality. Mild changes of COPD. Electronically Signed   By: Claudie Revering  M.D.   On: 07/31/2018 21:59   Ct Head Wo Contrast  Result Date: 07/31/2018 CLINICAL DATA:  Fall with head trauma EXAM: CT HEAD WITHOUT CONTRAST CT CERVICAL SPINE WITHOUT CONTRAST TECHNIQUE: Multidetector CT imaging of the head and cervical spine was performed following the standard protocol without intravenous contrast. Multiplanar CT image reconstructions of the cervical spine were also generated. COMPARISON:  None. FINDINGS: CT HEAD FINDINGS Brain: There is no mass, hemorrhage or extra-axial collection. The size and configuration of the ventricles and extra-axial CSF spaces are normal. The brain parenchyma is normal, without evidence of acute or chronic infarction. Vascular: No abnormal hyperdensity of the major intracranial arteries or dural venous sinuses. No intracranial atherosclerosis. Skull: The visualized skull base, calvarium and extracranial soft tissues are normal. Sinuses/Orbits: No fluid levels or advanced mucosal thickening of the visualized paranasal sinuses. No mastoid or middle ear effusion. The orbits are normal. CT CERVICAL SPINE FINDINGS Alignment: No static subluxation. Facets are aligned. Occipital condyles are normally positioned. Skull base and vertebrae: No acute fracture. Soft tissues and spinal canal: No prevertebral fluid or swelling. No visible canal hematoma. Disc levels: No advanced spinal canal or neural foraminal stenosis. Right C2-3 and C3-4 facet hypertrophy. Upper chest: No pneumothorax, pulmonary nodule or pleural effusion. Other: Normal visualized paraspinal cervical soft tissues. IMPRESSION: No acute abnormality of the head or cervical spine. Electronically Signed   By: Ulyses Jarred M.D.   On: 07/31/2018 22:21   Ct Cervical Spine Wo Contrast  Result Date: 07/31/2018 CLINICAL DATA:  Fall with head trauma EXAM: CT HEAD WITHOUT CONTRAST CT CERVICAL SPINE WITHOUT CONTRAST TECHNIQUE: Multidetector CT imaging of the head and cervical spine was performed following the standard  protocol without intravenous contrast. Multiplanar CT image reconstructions of the cervical spine were also generated. COMPARISON:  None. FINDINGS: CT HEAD FINDINGS Brain: There is no mass, hemorrhage or extra-axial collection. The size and configuration of the ventricles and extra-axial CSF spaces are normal. The brain parenchyma is normal, without evidence of acute or chronic infarction. Vascular: No abnormal hyperdensity of the major intracranial arteries or dural venous sinuses. No intracranial atherosclerosis. Skull: The visualized skull base, calvarium and extracranial soft tissues are normal. Sinuses/Orbits: No fluid levels or advanced mucosal thickening of the visualized paranasal sinuses. No mastoid or middle ear effusion. The orbits are normal. CT CERVICAL SPINE FINDINGS Alignment: No static subluxation. Facets are aligned. Occipital condyles are normally positioned. Skull base and vertebrae: No acute fracture. Soft tissues and spinal canal: No prevertebral fluid or swelling. No visible canal hematoma. Disc levels: No advanced spinal canal or neural foraminal stenosis. Right C2-3 and C3-4 facet hypertrophy. Upper chest: No pneumothorax, pulmonary nodule or pleural effusion. Other: Normal visualized paraspinal cervical soft tissues. IMPRESSION: No acute abnormality of the head or cervical spine. Electronically Signed   By: Ulyses Jarred M.D.   On: 07/31/2018 22:21  Ct Thoracic Spine Wo Contrast  Result Date: 07/31/2018 CLINICAL DATA:  Initial evaluation for acute trauma, mechanical fall. Back pain. EXAM: CT THORACIC AND LUMBAR SPINE WITHOUT CONTRAST TECHNIQUE: Multidetector CT imaging of the thoracic and lumbar spine was performed without contrast. Multiplanar CT image reconstructions were also generated. COMPARISON:  None. FINDINGS: CT THORACIC SPINE FINDINGS Alignment: Vertebral bodies normally aligned with preservation of the normal thoracic kyphosis. No listhesis. Vertebrae: Acute burst type  compression fracture involving the T12 vertebral body with mild 20% central height loss with 3 mm bony retropulsion. No significant stenosis. Vertebral body heights otherwise maintained without evidence for acute or chronic fracture. Few scattered sclerotic foci within the upper thoracic spine most consistent with benign bone islands. No other discrete or worrisome osseous lesions. Paraspinal and other soft tissues: Paraspinous soft tissues demonstrate no acute finding. Moderate aortic atherosclerosis. Prior cholecystectomy. Small hiatal hernia. Partially visualized lungs are grossly clear. Disc levels: Mild multilevel reactive endplate changes with disc desiccation present at T5-6, T6-7, T7-8, and T8-9. No significant stenosis within the thoracic spine. CT LUMBAR SPINE FINDINGS Segmentation: Standard. Lowest well-formed disc labeled the L5-S1 level. Alignment: Vertebral bodies normally aligned with preservation of the normal lumbar lordosis. No listhesis or malalignment. Vertebrae: Vertebral body heights maintained without evidence for acute or chronic fracture. Visualized sacrum and pelvis intact. SI joints approximated symmetric. No discrete osseous lesions. Paraspinal and other soft tissues: Paraspinous soft tissues demonstrate no acute abnormality. Moderate aorto bi-iliac atherosclerotic disease without visible aneurysm. Disc levels: Degenerative disc desiccation with mild disc bulging at L4-5. Chronic intervertebral disc space narrowing with diffuse disc bulge and reactive endplate changes at O3-Z8. Moderate facet hypertrophy at L4-5 and L5-S1. No significant stenosis. IMPRESSION: CT THORACIC SPINE IMPRESSION 1. Acute burst type compression fracture involving the T12 vertebral body with up to 20% central height loss and 3 mm bony retropulsion. No significant stenosis. 2. No other acute traumatic injury within the thoracic spine. CT LUMBAR SPINE IMPRESSION 1. No acute traumatic injury within the lumbar spine.  2. Mild moderate degenerate spondylolysis and facet arthrosis at L4-5 and L5-S1 without significant stenosis. Electronically Signed   By: Jeannine Boga M.D.   On: 07/31/2018 22:24   Ct Lumbar Spine Wo Contrast  Result Date: 07/31/2018 CLINICAL DATA:  Initial evaluation for acute trauma, mechanical fall. Back pain. EXAM: CT THORACIC AND LUMBAR SPINE WITHOUT CONTRAST TECHNIQUE: Multidetector CT imaging of the thoracic and lumbar spine was performed without contrast. Multiplanar CT image reconstructions were also generated. COMPARISON:  None. FINDINGS: CT THORACIC SPINE FINDINGS Alignment: Vertebral bodies normally aligned with preservation of the normal thoracic kyphosis. No listhesis. Vertebrae: Acute burst type compression fracture involving the T12 vertebral body with mild 20% central height loss with 3 mm bony retropulsion. No significant stenosis. Vertebral body heights otherwise maintained without evidence for acute or chronic fracture. Few scattered sclerotic foci within the upper thoracic spine most consistent with benign bone islands. No other discrete or worrisome osseous lesions. Paraspinal and other soft tissues: Paraspinous soft tissues demonstrate no acute finding. Moderate aortic atherosclerosis. Prior cholecystectomy. Small hiatal hernia. Partially visualized lungs are grossly clear. Disc levels: Mild multilevel reactive endplate changes with disc desiccation present at T5-6, T6-7, T7-8, and T8-9. No significant stenosis within the thoracic spine. CT LUMBAR SPINE FINDINGS Segmentation: Standard. Lowest well-formed disc labeled the L5-S1 level. Alignment: Vertebral bodies normally aligned with preservation of the normal lumbar lordosis. No listhesis or malalignment. Vertebrae: Vertebral body heights maintained without evidence for acute or chronic fracture.  Visualized sacrum and pelvis intact. SI joints approximated symmetric. No discrete osseous lesions. Paraspinal and other soft tissues:  Paraspinous soft tissues demonstrate no acute abnormality. Moderate aorto bi-iliac atherosclerotic disease without visible aneurysm. Disc levels: Degenerative disc desiccation with mild disc bulging at L4-5. Chronic intervertebral disc space narrowing with diffuse disc bulge and reactive endplate changes at S8-N4. Moderate facet hypertrophy at L4-5 and L5-S1. No significant stenosis. IMPRESSION: CT THORACIC SPINE IMPRESSION 1. Acute burst type compression fracture involving the T12 vertebral body with up to 20% central height loss and 3 mm bony retropulsion. No significant stenosis. 2. No other acute traumatic injury within the thoracic spine. CT LUMBAR SPINE IMPRESSION 1. No acute traumatic injury within the lumbar spine. 2. Mild moderate degenerate spondylolysis and facet arthrosis at L4-5 and L5-S1 without significant stenosis. Electronically Signed   By: Jeannine Boga M.D.   On: 07/31/2018 22:24   Mr Thoracic Spine Wo Contrast  Result Date: 08/01/2018 CLINICAL DATA:  Severe back pain and nausea. Slipped and fell while coming into the house. EXAM: MRI THORACIC SPINE WITHOUT CONTRAST TECHNIQUE: Multiplanar, multisequence MR imaging of the thoracic spine was performed. No intravenous contrast was administered. COMPARISON:  None. FINDINGS: Alignment:  Physiologic. Vertebrae: T12 vertebral body compression fracture with 10% height loss and marrow edema within the vertebral body with 1 mm of retropulsion of the superior posterior margin. Remainder the vertebral body heights are maintained. No other fracture. No discitis or osteomyelitis. No aggressive osseous lesion. Cord:  Normal signal and morphology. Paraspinal and other soft tissues: No acute paraspinal abnormality. Disc levels: Disc spaces:  Disc spaces are maintained. T1-T2: No disc protrusion, foraminal stenosis or central canal stenosis. T2-T3: Small central disc protrusion. No foraminal or central canal stenosis. T3-T4: No disc protrusion, foraminal  stenosis or central canal stenosis. T4-T5: No disc protrusion, foraminal stenosis or central canal stenosis. T5-T6: No disc protrusion, foraminal stenosis or central canal stenosis. T6-T7: No disc protrusion, foraminal stenosis or central canal stenosis. T7-T8: Tiny right paracentral disc protrusion. No foraminal or central canal stenosis. T8-T9: No disc protrusion, foraminal stenosis or central canal stenosis. T9-T10: No disc protrusion, foraminal stenosis or central canal stenosis. T10-T11: No disc protrusion, foraminal stenosis or central canal stenosis. T11-T12: No disc protrusion, foraminal stenosis or central canal stenosis. IMPRESSION: 1. Acute T12 vertebral body compression fracture with approximately 10% height loss. Electronically Signed   By: Kathreen Devoid   On: 08/01/2018 12:08   ASSESSMENT AND PLAN:   1.T12 compression fracture Control with tramadol.  MRI of the thoracic spine report T2 10% compression fracture. PT evaluation suggest rolling walker without PT follow-up. Per Dr. Adair Laundry, the patient can follow-up orthopedic clinic as outpatient.  2.Intractable nausea She is treated with IV antiemetic.  She still has nausea.  3.dehydration Improved with IV fluids.  4. Diabetes mellitus - Moderate sliding scale insulin, blood sugars controlled.  5. Hypertension Blood pressure is controlled.  Hyponatremia.  Improved with normal saline IV. Elevated bilirubin.  LFTs normal.  Follow-up daily level in am. Discussed with Dr. Marry Guan. All the records are reviewed and case discussed with Care Management/Social Worker. Management plans discussed with the patient, family and they are in agreement.  CODE STATUS: Full Code  TOTAL TIME TAKING CARE OF THIS PATIENT: 32 minutes.   More than 50% of the time was spent in counseling/coordination of care: YES  POSSIBLE D/C IN 1 DAYS, DEPENDING ON CLINICAL CONDITION.   Demetrios Loll M.D on 08/01/2018 at 4:05 PM  Between 7am  to 6pm -  Pager - [phone]  After 6pm go to [email or web address] - [facility]

## 2018-08-01 NOTE — Care Management Obs Status (Signed)
[facility] NOTIFICATION   Patient Details  Name: Wanda Baird MRN: [id number] Date of Birth: [birth year]   Medicare Observation Status Notification Given:  Yes    Beverly Sessions, RN 08/01/2018, 2:36 PM

## 2018-08-01 NOTE — Progress Notes (Signed)
Advanced Care Plan.  Purpose of Encounter: CODE STATUS. Parties in Attendance: The patient and me. Patient's Decisional Capacity: Yes. Medical Story: Wanda Baird  is a 85 y.o. female with a known history of hypertension and diabetes mellitus.  She is admitted for thoracic spine fracture.  I discussed with the patient about her current condition, prognosis and CODE STATUS.  Initially, the patient wants do not resuscitation or intubation if she has cardiopulmonary arrest.  Then she said she will discuss this issue with her son then decide. Plan:  Code Status: Full code. Time spent discussing advance care planning: 17 minutes.

## 2018-08-01 NOTE — Discharge Summary (Signed)
[facility] [place] NAME: Wanda Baird    MR#:  [id number]  DATE OF BIRTH:  [birth year]  DATE OF ADMISSION:  07/31/2018   ADMITTING PHYSICIAN: Christel Mormon, MD  DATE OF DISCHARGE: 08/01/2018  PRIMARY CARE PHYSICIAN: Derinda Late, MD   ADMISSION DIAGNOSIS:  Fall, initial encounter [W19.XXXA] DISCHARGE DIAGNOSIS:  Active Problems:   Fall  SECONDARY DIAGNOSIS:   Past Medical History:  Diagnosis Date   Diabetes mellitus without complication ([place])    Hypertension    HOSPITAL COURSE:  1. T12 compression fracture Control with tramadol.  MRI of the thoracic spine report T2 10% compression fracture. PT evaluation suggest rolling walker without PT follow-up. Per Dr. Adair Laundry, the patient can follow-up orthopedic clinic as outpatient.  2.  Intractable nausea Improved, she is treated with IV antiemetic  3. dehydration Improved with IV fluids.  4.  Diabetes mellitus - Moderate sliding scale insulin, blood sugars controlled.  5.  Hypertension Blood pressure is controlled.  Hyponatremia.  Improved with normal saline IV. Elevated bilirubin.  LFTs normal.  Follow-up daily level with PCP as outpatient. DISCHARGE CONDITIONS:  Stable, discharge to home today. CONSULTS OBTAINED:  Treatment Team:  Dereck Leep, MD DRUG ALLERGIES:   Allergies  Allergen Reactions   Codeine     REACTION: nausea and vomiting   Percocet [Oxycodone-Acetaminophen] Nausea Only   DISCHARGE MEDICATIONS:   Allergies as of 08/01/2018      Reactions   Codeine    REACTION: nausea and vomiting   Percocet [oxycodone-acetaminophen] Nausea Only      Medication List    STOP taking these medications   ondansetron 4 MG tablet Commonly known as:  Zofran     TAKE these medications   ondansetron 4 MG disintegrating tablet Commonly known as:  Zofran ODT Take 1 tablet (4 mg total) by mouth every 6 (six) hours as needed for nausea or vomiting.    traMADol 50 MG tablet Commonly known as:  ULTRAM Take 1 tablet (50 mg total) by mouth every 6 (six) hours as needed for moderate pain.            Durable Medical Equipment  (From admission, onward)         Start     Ordered   08/01/18 1451  For home use only DME Walker rolling  Once    Question:  Patient needs a walker to treat with the following condition  Answer:  Thoracic spine fracture ([place])   08/01/18 1451           DISCHARGE INSTRUCTIONS:  See AVS.  If you experience worsening of your admission symptoms, develop shortness of breath, life threatening emergency, suicidal or homicidal thoughts you must seek medical attention immediately by calling 911 or calling your MD immediately  if symptoms less severe.  You Must read complete instructions/literature along with all the possible adverse reactions/side effects for all the Medicines you take and that have been prescribed to you. Take any new Medicines after you have completely understood and accpet all the possible adverse reactions/side effects.   Please note  You were cared for by a hospitalist during your hospital stay. If you have any questions about your discharge medications or the care you received while you were in the hospital after you are discharged, you can call the unit and asked to speak with the hospitalist on call if the hospitalist that took care of you is not available. Once you  are discharged, your primary care physician will handle any further medical issues. Please note that NO REFILLS for any discharge medications will be authorized once you are discharged, as it is imperative that you return to your primary care physician (or establish a relationship with a primary care physician if you do not have one) for your aftercare needs so that they can reassess your need for medications and monitor your lab values.    On the day of Discharge:  VITAL SIGNS:  Blood pressure (!) 155/72, pulse 78, temperature  (!) 97.4 F (36.3 C), temperature source Oral, resp. rate 20, height 5\' 4"  (1.626 m), weight 69.6 kg, SpO2 96 %. PHYSICAL EXAMINATION:  GENERAL:  85 y.o.-year-old patient lying in the bed with no acute distress.  EYES: Pupils equal, round, reactive to light and accommodation. No scleral icterus. Extraocular muscles intact.  HEENT: Head atraumatic, normocephalic. Oropharynx and nasopharynx clear.  NECK:  Supple, no jugular venous distention. No thyroid enlargement, no tenderness.  LUNGS: Normal breath sounds bilaterally, no wheezing, rales,rhonchi or crepitation. No use of accessory muscles of respiration.  CARDIOVASCULAR: S1, S2 normal. No murmurs, rubs, or gallops.  ABDOMEN: Soft, non-tender, non-distended. Bowel sounds present. No organomegaly or mass.  EXTREMITIES: No pedal edema, cyanosis, or clubbing.  NEUROLOGIC: Cranial nerves II through XII are intact. Muscle strength 5/5 in all extremities. Sensation intact. Gait not checked.  PSYCHIATRIC: The patient is alert and oriented x 3.  SKIN: No obvious rash, lesion, or ulcer.  DATA REVIEW:   CBC Recent Labs  Lab 08/01/18 0353  WBC 9.8  HGB 12.5  HCT 36.3  PLT 153    Chemistries  Recent Labs  Lab 07/31/18 2115 08/01/18 0353  NA 130* 135  K 3.8 3.6  CL 95* 101  CO2 23 26  GLUCOSE 216* 175*  BUN 20 15  CREATININE 0.58 0.59  CALCIUM 9.0 8.8*  AST 26  --   ALT 22  --   ALKPHOS 56  --   BILITOT 2.2*  --      Microbiology Results  Results for orders placed or performed during the hospital encounter of 07/31/18  SARS Coronavirus 2 (CEPHEID - Performed in [place] [facility] hospital lab), Hosp Order     Status: None   Collection Time: 08/01/18  2:18 AM  Result Value Ref Range Status   SARS Coronavirus 2 NEGATIVE NEGATIVE Final    Comment: (NOTE) If result is NEGATIVE SARS-CoV-2 target nucleic acids are NOT DETECTED. The SARS-CoV-2 RNA is generally detectable in upper and lower  respiratory specimens during the acute phase of  infection. The lowest  concentration of SARS-CoV-2 viral copies this assay can detect is 250  copies / mL. A negative result does not preclude SARS-CoV-2 infection  and should not be used as the sole basis for treatment or other  patient management decisions.  A negative result may occur with  improper specimen collection / handling, submission of specimen other  than nasopharyngeal swab, presence of viral mutation(s) within the  areas targeted by this assay, and inadequate number of viral copies  (<250 copies / mL). A negative result must be combined with clinical  observations, patient history, and epidemiological information. If result is POSITIVE SARS-CoV-2 target nucleic acids are DETECTED. The SARS-CoV-2 RNA is generally detectable in upper and lower  respiratory specimens dur ing the acute phase of infection.  Positive  results are indicative of active infection with SARS-CoV-2.  Clinical  correlation with patient history and other diagnostic information  is  necessary to determine patient infection status.  Positive results do  not rule out bacterial infection or co-infection with other viruses. If result is PRESUMPTIVE POSTIVE SARS-CoV-2 nucleic acids MAY BE PRESENT.   A presumptive positive result was obtained on the submitted specimen  and confirmed on repeat testing.  While 2019 novel coronavirus  (SARS-CoV-2) nucleic acids may be present in the submitted sample  additional confirmatory testing may be necessary for epidemiological  and / or clinical management purposes  to differentiate between  SARS-CoV-2 and other Sarbecovirus currently known to infect humans.  If clinically indicated additional testing with an alternate test  methodology [phone]) is advised. The SARS-CoV-2 RNA is generally  detectable in upper and lower respiratory sp ecimens during the acute  phase of infection. The expected result is Negative. Fact Sheet for Patients:   StrictlyIdeas.no Fact Sheet for Healthcare Providers: BankingDealers.co.za This test is not yet approved or cleared by the [place] FDA and has been authorized for detection and/or diagnosis of SARS-CoV-2 by FDA under an Emergency Use Authorization (EUA).  This EUA will remain in effect (meaning this test can be used) for the duration of the COVID-19 declaration under Section 564(b)(1) of the Act, 21 U.S.C. section 360bbb-3(b)(1), unless the authorization is terminated or revoked sooner. Performed at [facility], [address] [place], [place] [address]     RADIOLOGY:  Dg Chest 1 View  Result Date: 07/31/2018 CLINICAL DATA:  Upper back pain following a fall. EXAM: CHEST  1 VIEW COMPARISON:  06/21/2006. FINDINGS: The heart remains grossly normal in size. The lungs are mildly hyperexpanded and clear. Diffuse osteopenia, mild left glenohumeral joint degenerative changes and moderate bilateral AC joint degenerative changes. Minimal scoliosis. No fracture or pneumothorax seen. IMPRESSION: No acute abnormality. Mild changes of COPD. Electronically Signed   By: Claudie Revering M.D.   On: 07/31/2018 21:59   Ct Head Wo Contrast  Result Date: 07/31/2018 CLINICAL DATA:  Fall with head trauma EXAM: CT HEAD WITHOUT CONTRAST CT CERVICAL SPINE WITHOUT CONTRAST TECHNIQUE: Multidetector CT imaging of the head and cervical spine was performed following the standard protocol without intravenous contrast. Multiplanar CT image reconstructions of the cervical spine were also generated. COMPARISON:  None. FINDINGS: CT HEAD FINDINGS Brain: There is no mass, hemorrhage or extra-axial collection. The size and configuration of the ventricles and extra-axial CSF spaces are normal. The brain parenchyma is normal, without evidence of acute or chronic infarction. Vascular: No abnormal hyperdensity of the major intracranial arteries or dural venous sinuses. No  intracranial atherosclerosis. Skull: The visualized skull base, calvarium and extracranial soft tissues are normal. Sinuses/Orbits: No fluid levels or advanced mucosal thickening of the visualized paranasal sinuses. No mastoid or middle ear effusion. The orbits are normal. CT CERVICAL SPINE FINDINGS Alignment: No static subluxation. Facets are aligned. Occipital condyles are normally positioned. Skull base and vertebrae: No acute fracture. Soft tissues and spinal canal: No prevertebral fluid or swelling. No visible canal hematoma. Disc levels: No advanced spinal canal or neural foraminal stenosis. Right C2-3 and C3-4 facet hypertrophy. Upper chest: No pneumothorax, pulmonary nodule or pleural effusion. Other: Normal visualized paraspinal cervical soft tissues. IMPRESSION: No acute abnormality of the head or cervical spine. Electronically Signed   By: Ulyses Jarred M.D.   On: 07/31/2018 22:21   Ct Cervical Spine Wo Contrast  Result Date: 07/31/2018 CLINICAL DATA:  Fall with head trauma EXAM: CT HEAD WITHOUT CONTRAST CT CERVICAL SPINE WITHOUT CONTRAST TECHNIQUE: Multidetector CT imaging of the  head and cervical spine was performed following the standard protocol without intravenous contrast. Multiplanar CT image reconstructions of the cervical spine were also generated. COMPARISON:  None. FINDINGS: CT HEAD FINDINGS Brain: There is no mass, hemorrhage or extra-axial collection. The size and configuration of the ventricles and extra-axial CSF spaces are normal. The brain parenchyma is normal, without evidence of acute or chronic infarction. Vascular: No abnormal hyperdensity of the major intracranial arteries or dural venous sinuses. No intracranial atherosclerosis. Skull: The visualized skull base, calvarium and extracranial soft tissues are normal. Sinuses/Orbits: No fluid levels or advanced mucosal thickening of the visualized paranasal sinuses. No mastoid or middle ear effusion. The orbits are normal. CT CERVICAL  SPINE FINDINGS Alignment: No static subluxation. Facets are aligned. Occipital condyles are normally positioned. Skull base and vertebrae: No acute fracture. Soft tissues and spinal canal: No prevertebral fluid or swelling. No visible canal hematoma. Disc levels: No advanced spinal canal or neural foraminal stenosis. Right C2-3 and C3-4 facet hypertrophy. Upper chest: No pneumothorax, pulmonary nodule or pleural effusion. Other: Normal visualized paraspinal cervical soft tissues. IMPRESSION: No acute abnormality of the head or cervical spine. Electronically Signed   By: Ulyses Jarred M.D.   On: 07/31/2018 22:21   Ct Thoracic Spine Wo Contrast  Result Date: 07/31/2018 CLINICAL DATA:  Initial evaluation for acute trauma, mechanical fall. Back pain. EXAM: CT THORACIC AND LUMBAR SPINE WITHOUT CONTRAST TECHNIQUE: Multidetector CT imaging of the thoracic and lumbar spine was performed without contrast. Multiplanar CT image reconstructions were also generated. COMPARISON:  None. FINDINGS: CT THORACIC SPINE FINDINGS Alignment: Vertebral bodies normally aligned with preservation of the normal thoracic kyphosis. No listhesis. Vertebrae: Acute burst type compression fracture involving the T12 vertebral body with mild 20% central height loss with 3 mm bony retropulsion. No significant stenosis. Vertebral body heights otherwise maintained without evidence for acute or chronic fracture. Few scattered sclerotic foci within the upper thoracic spine most consistent with benign bone islands. No other discrete or worrisome osseous lesions. Paraspinal and other soft tissues: Paraspinous soft tissues demonstrate no acute finding. Moderate aortic atherosclerosis. Prior cholecystectomy. Small hiatal hernia. Partially visualized lungs are grossly clear. Disc levels: Mild multilevel reactive endplate changes with disc desiccation present at T5-6, T6-7, T7-8, and T8-9. No significant stenosis within the thoracic spine. CT LUMBAR SPINE  FINDINGS Segmentation: Standard. Lowest well-formed disc labeled the L5-S1 level. Alignment: Vertebral bodies normally aligned with preservation of the normal lumbar lordosis. No listhesis or malalignment. Vertebrae: Vertebral body heights maintained without evidence for acute or chronic fracture. Visualized sacrum and pelvis intact. SI joints approximated symmetric. No discrete osseous lesions. Paraspinal and other soft tissues: Paraspinous soft tissues demonstrate no acute abnormality. Moderate aorto bi-iliac atherosclerotic disease without visible aneurysm. Disc levels: Degenerative disc desiccation with mild disc bulging at L4-5. Chronic intervertebral disc space narrowing with diffuse disc bulge and reactive endplate changes at W7-P7. Moderate facet hypertrophy at L4-5 and L5-S1. No significant stenosis. IMPRESSION: CT THORACIC SPINE IMPRESSION 1. Acute burst type compression fracture involving the T12 vertebral body with up to 20% central height loss and 3 mm bony retropulsion. No significant stenosis. 2. No other acute traumatic injury within the thoracic spine. CT LUMBAR SPINE IMPRESSION 1. No acute traumatic injury within the lumbar spine. 2. Mild moderate degenerate spondylolysis and facet arthrosis at L4-5 and L5-S1 without significant stenosis. Electronically Signed   By: Jeannine Boga M.D.   On: 07/31/2018 22:24   Ct Lumbar Spine Wo Contrast  Result Date: 07/31/2018 CLINICAL DATA:  Initial evaluation for acute trauma, mechanical fall. Back pain. EXAM: CT THORACIC AND LUMBAR SPINE WITHOUT CONTRAST TECHNIQUE: Multidetector CT imaging of the thoracic and lumbar spine was performed without contrast. Multiplanar CT image reconstructions were also generated. COMPARISON:  None. FINDINGS: CT THORACIC SPINE FINDINGS Alignment: Vertebral bodies normally aligned with preservation of the normal thoracic kyphosis. No listhesis. Vertebrae: Acute burst type compression fracture involving the T12 vertebral  body with mild 20% central height loss with 3 mm bony retropulsion. No significant stenosis. Vertebral body heights otherwise maintained without evidence for acute or chronic fracture. Few scattered sclerotic foci within the upper thoracic spine most consistent with benign bone islands. No other discrete or worrisome osseous lesions. Paraspinal and other soft tissues: Paraspinous soft tissues demonstrate no acute finding. Moderate aortic atherosclerosis. Prior cholecystectomy. Small hiatal hernia. Partially visualized lungs are grossly clear. Disc levels: Mild multilevel reactive endplate changes with disc desiccation present at T5-6, T6-7, T7-8, and T8-9. No significant stenosis within the thoracic spine. CT LUMBAR SPINE FINDINGS Segmentation: Standard. Lowest well-formed disc labeled the L5-S1 level. Alignment: Vertebral bodies normally aligned with preservation of the normal lumbar lordosis. No listhesis or malalignment. Vertebrae: Vertebral body heights maintained without evidence for acute or chronic fracture. Visualized sacrum and pelvis intact. SI joints approximated symmetric. No discrete osseous lesions. Paraspinal and other soft tissues: Paraspinous soft tissues demonstrate no acute abnormality. Moderate aorto bi-iliac atherosclerotic disease without visible aneurysm. Disc levels: Degenerative disc desiccation with mild disc bulging at L4-5. Chronic intervertebral disc space narrowing with diffuse disc bulge and reactive endplate changes at U5-K2. Moderate facet hypertrophy at L4-5 and L5-S1. No significant stenosis. IMPRESSION: CT THORACIC SPINE IMPRESSION 1. Acute burst type compression fracture involving the T12 vertebral body with up to 20% central height loss and 3 mm bony retropulsion. No significant stenosis. 2. No other acute traumatic injury within the thoracic spine. CT LUMBAR SPINE IMPRESSION 1. No acute traumatic injury within the lumbar spine. 2. Mild moderate degenerate spondylolysis and  facet arthrosis at L4-5 and L5-S1 without significant stenosis. Electronically Signed   By: Jeannine Boga M.D.   On: 07/31/2018 22:24   Mr Thoracic Spine Wo Contrast  Result Date: 08/01/2018 CLINICAL DATA:  Severe back pain and nausea. Slipped and fell while coming into the house. EXAM: MRI THORACIC SPINE WITHOUT CONTRAST TECHNIQUE: Multiplanar, multisequence MR imaging of the thoracic spine was performed. No intravenous contrast was administered. COMPARISON:  None. FINDINGS: Alignment:  Physiologic. Vertebrae: T12 vertebral body compression fracture with 10% height loss and marrow edema within the vertebral body with 1 mm of retropulsion of the superior posterior margin. Remainder the vertebral body heights are maintained. No other fracture. No discitis or osteomyelitis. No aggressive osseous lesion. Cord:  Normal signal and morphology. Paraspinal and other soft tissues: No acute paraspinal abnormality. Disc levels: Disc spaces:  Disc spaces are maintained. T1-T2: No disc protrusion, foraminal stenosis or central canal stenosis. T2-T3: Small central disc protrusion. No foraminal or central canal stenosis. T3-T4: No disc protrusion, foraminal stenosis or central canal stenosis. T4-T5: No disc protrusion, foraminal stenosis or central canal stenosis. T5-T6: No disc protrusion, foraminal stenosis or central canal stenosis. T6-T7: No disc protrusion, foraminal stenosis or central canal stenosis. T7-T8: Tiny right paracentral disc protrusion. No foraminal or central canal stenosis. T8-T9: No disc protrusion, foraminal stenosis or central canal stenosis. T9-T10: No disc protrusion, foraminal stenosis or central canal stenosis. T10-T11: No disc protrusion, foraminal stenosis or central canal stenosis. T11-T12: No disc protrusion, foraminal stenosis or central  canal stenosis. IMPRESSION: 1. Acute T12 vertebral body compression fracture with approximately 10% height loss. Electronically Signed   By: Kathreen Devoid    On: 08/01/2018 12:08     Management plans discussed with the patient, family and they are in agreement.  CODE STATUS: Full Code   TOTAL TIME TAKING CARE OF THIS PATIENT: 32 minutes.    Demetrios Loll M.D on 08/01/2018 at 2:51 PM  Between 7am to 6pm - Pager - [phone]  After 6pm go to [email or web address] - Proofreader  [facility]  Office  [phone]  CC: Primary care physician; Derinda Late, MD   Note: This dictation was prepared with Dragon dictation along with smaller phrase technology. Any transcriptional errors that result from this process are unintentional.

## 2018-08-01 NOTE — Consult Note (Signed)
ORTHOPAEDIC CONSULTATION  PATIENT NAME: Wanda Baird DOB: [birth year]  MRN: [id number]  REQUESTING PHYSICIAN: Demetrios Loll, MD  Chief Complaint: Back pain  HPI: Wanda Baird is a 85 y.o. female who complains of severe back pain and nausea.  The patient had been outside her home washing her deck yesterday.  When she came back into the home she slipped on her hardwood floors and landed on her back.  She had the immediate onset of severe mid to lower back pain.  She denied any loss of consciousness.  She denied any other injuries.  She has had some nausea that she relates to the severity of the pain.  She denies any gross numbness or weakness.  She denies any radiation of pain down the legs.  The pain is worse with attempts at sitting up or standing.  Past Medical History:  Diagnosis Date  . Diabetes mellitus without complication ([place])   . Hypertension    Past Surgical History:  Procedure Laterality Date  . BACK SURGERY     Social History   Socioeconomic History  . Marital status: Widowed    Spouse name: Not on file  . Number of children: Not on file  . Years of education: Not on file  . Highest education level: Not on file  Occupational History  . Not on file  Social Needs  . Financial resource strain: Not on file  . Food insecurity:    Worry: Not on file    Inability: Not on file  . Transportation needs:    Medical: Not on file    Non-medical: Not on file  Tobacco Use  . Smoking status: Never Smoker  . Smokeless tobacco: Never Used  Substance and Sexual Activity  . Alcohol use: No  . Drug use: No  . Sexual activity: Not on file  Lifestyle  . Physical activity:    Days per week: Not on file    Minutes per session: Not on file  . Stress: Not on file  Relationships  . Social connections:    Talks on phone: Not on file    Gets together: Not on file    Attends religious service: Not on file    Active member of club or organization: Not on file     Attends meetings of clubs or organizations: Not on file    Relationship status: Not on file  Other Topics Concern  . Not on file  Social History Narrative  . Not on file   Family History  Problem Relation Age of Onset  . Breast cancer Maternal Aunt    Allergies  Allergen Reactions  . Codeine     REACTION: nausea and vomiting  . Percocet [Oxycodone-Acetaminophen] Nausea Only   Prior to Admission medications   Medication Sig Start Date End Date Taking? Authorizing Provider  ondansetron (ZOFRAN ODT) 4 MG disintegrating tablet Take 1 tablet (4 mg total) by mouth every 6 (six) hours as needed for nausea or vomiting. 07/30/15   Delman Kitten, MD  ondansetron (ZOFRAN) 4 MG tablet Take 1-2 tabs by mouth every 8 hours as needed for nausea/vomiting 11/20/14   Hinda Kehr, MD   Dg Chest 1 View  Result Date: 07/31/2018 CLINICAL DATA:  Upper back pain following a fall. EXAM: CHEST  1 VIEW COMPARISON:  06/21/2006. FINDINGS: The heart remains grossly normal in size. The lungs are mildly hyperexpanded and clear. Diffuse osteopenia, mild left glenohumeral joint degenerative changes and moderate bilateral AC joint degenerative changes. Minimal scoliosis.  No fracture or pneumothorax seen. IMPRESSION: No acute abnormality. Mild changes of COPD. Electronically Signed   By: Claudie Revering M.D.   On: 07/31/2018 21:59   Ct Head Wo Contrast  Result Date: 07/31/2018 CLINICAL DATA:  Fall with head trauma EXAM: CT HEAD WITHOUT CONTRAST CT CERVICAL SPINE WITHOUT CONTRAST TECHNIQUE: Multidetector CT imaging of the head and cervical spine was performed following the standard protocol without intravenous contrast. Multiplanar CT image reconstructions of the cervical spine were also generated. COMPARISON:  None. FINDINGS: CT HEAD FINDINGS Brain: There is no mass, hemorrhage or extra-axial collection. The size and configuration of the ventricles and extra-axial CSF spaces are normal. The brain parenchyma is normal, without  evidence of acute or chronic infarction. Vascular: No abnormal hyperdensity of the major intracranial arteries or dural venous sinuses. No intracranial atherosclerosis. Skull: The visualized skull base, calvarium and extracranial soft tissues are normal. Sinuses/Orbits: No fluid levels or advanced mucosal thickening of the visualized paranasal sinuses. No mastoid or middle ear effusion. The orbits are normal. CT CERVICAL SPINE FINDINGS Alignment: No static subluxation. Facets are aligned. Occipital condyles are normally positioned. Skull base and vertebrae: No acute fracture. Soft tissues and spinal canal: No prevertebral fluid or swelling. No visible canal hematoma. Disc levels: No advanced spinal canal or neural foraminal stenosis. Right C2-3 and C3-4 facet hypertrophy. Upper chest: No pneumothorax, pulmonary nodule or pleural effusion. Other: Normal visualized paraspinal cervical soft tissues. IMPRESSION: No acute abnormality of the head or cervical spine. Electronically Signed   By: Ulyses Jarred M.D.   On: 07/31/2018 22:21   Ct Cervical Spine Wo Contrast  Result Date: 07/31/2018 CLINICAL DATA:  Fall with head trauma EXAM: CT HEAD WITHOUT CONTRAST CT CERVICAL SPINE WITHOUT CONTRAST TECHNIQUE: Multidetector CT imaging of the head and cervical spine was performed following the standard protocol without intravenous contrast. Multiplanar CT image reconstructions of the cervical spine were also generated. COMPARISON:  None. FINDINGS: CT HEAD FINDINGS Brain: There is no mass, hemorrhage or extra-axial collection. The size and configuration of the ventricles and extra-axial CSF spaces are normal. The brain parenchyma is normal, without evidence of acute or chronic infarction. Vascular: No abnormal hyperdensity of the major intracranial arteries or dural venous sinuses. No intracranial atherosclerosis. Skull: The visualized skull base, calvarium and extracranial soft tissues are normal. Sinuses/Orbits: No fluid  levels or advanced mucosal thickening of the visualized paranasal sinuses. No mastoid or middle ear effusion. The orbits are normal. CT CERVICAL SPINE FINDINGS Alignment: No static subluxation. Facets are aligned. Occipital condyles are normally positioned. Skull base and vertebrae: No acute fracture. Soft tissues and spinal canal: No prevertebral fluid or swelling. No visible canal hematoma. Disc levels: No advanced spinal canal or neural foraminal stenosis. Right C2-3 and C3-4 facet hypertrophy. Upper chest: No pneumothorax, pulmonary nodule or pleural effusion. Other: Normal visualized paraspinal cervical soft tissues. IMPRESSION: No acute abnormality of the head or cervical spine. Electronically Signed   By: Ulyses Jarred M.D.   On: 07/31/2018 22:21   Ct Thoracic Spine Wo Contrast  Result Date: 07/31/2018 CLINICAL DATA:  Initial evaluation for acute trauma, mechanical fall. Back pain. EXAM: CT THORACIC AND LUMBAR SPINE WITHOUT CONTRAST TECHNIQUE: Multidetector CT imaging of the thoracic and lumbar spine was performed without contrast. Multiplanar CT image reconstructions were also generated. COMPARISON:  None. FINDINGS: CT THORACIC SPINE FINDINGS Alignment: Vertebral bodies normally aligned with preservation of the normal thoracic kyphosis. No listhesis. Vertebrae: Acute burst type compression fracture involving the T12 vertebral body with  mild 20% central height loss with 3 mm bony retropulsion. No significant stenosis. Vertebral body heights otherwise maintained without evidence for acute or chronic fracture. Few scattered sclerotic foci within the upper thoracic spine most consistent with benign bone islands. No other discrete or worrisome osseous lesions. Paraspinal and other soft tissues: Paraspinous soft tissues demonstrate no acute finding. Moderate aortic atherosclerosis. Prior cholecystectomy. Small hiatal hernia. Partially visualized lungs are grossly clear. Disc levels: Mild multilevel reactive  endplate changes with disc desiccation present at T5-6, T6-7, T7-8, and T8-9. No significant stenosis within the thoracic spine. CT LUMBAR SPINE FINDINGS Segmentation: Standard. Lowest well-formed disc labeled the L5-S1 level. Alignment: Vertebral bodies normally aligned with preservation of the normal lumbar lordosis. No listhesis or malalignment. Vertebrae: Vertebral body heights maintained without evidence for acute or chronic fracture. Visualized sacrum and pelvis intact. SI joints approximated symmetric. No discrete osseous lesions. Paraspinal and other soft tissues: Paraspinous soft tissues demonstrate no acute abnormality. Moderate aorto bi-iliac atherosclerotic disease without visible aneurysm. Disc levels: Degenerative disc desiccation with mild disc bulging at L4-5. Chronic intervertebral disc space narrowing with diffuse disc bulge and reactive endplate changes at U2-G2. Moderate facet hypertrophy at L4-5 and L5-S1. No significant stenosis. IMPRESSION: CT THORACIC SPINE IMPRESSION 1. Acute burst type compression fracture involving the T12 vertebral body with up to 20% central height loss and 3 mm bony retropulsion. No significant stenosis. 2. No other acute traumatic injury within the thoracic spine. CT LUMBAR SPINE IMPRESSION 1. No acute traumatic injury within the lumbar spine. 2. Mild moderate degenerate spondylolysis and facet arthrosis at L4-5 and L5-S1 without significant stenosis. Electronically Signed   By: Jeannine Boga M.D.   On: 07/31/2018 22:24   Ct Lumbar Spine Wo Contrast  Result Date: 07/31/2018 CLINICAL DATA:  Initial evaluation for acute trauma, mechanical fall. Back pain. EXAM: CT THORACIC AND LUMBAR SPINE WITHOUT CONTRAST TECHNIQUE: Multidetector CT imaging of the thoracic and lumbar spine was performed without contrast. Multiplanar CT image reconstructions were also generated. COMPARISON:  None. FINDINGS: CT THORACIC SPINE FINDINGS Alignment: Vertebral bodies normally aligned  with preservation of the normal thoracic kyphosis. No listhesis. Vertebrae: Acute burst type compression fracture involving the T12 vertebral body with mild 20% central height loss with 3 mm bony retropulsion. No significant stenosis. Vertebral body heights otherwise maintained without evidence for acute or chronic fracture. Few scattered sclerotic foci within the upper thoracic spine most consistent with benign bone islands. No other discrete or worrisome osseous lesions. Paraspinal and other soft tissues: Paraspinous soft tissues demonstrate no acute finding. Moderate aortic atherosclerosis. Prior cholecystectomy. Small hiatal hernia. Partially visualized lungs are grossly clear. Disc levels: Mild multilevel reactive endplate changes with disc desiccation present at T5-6, T6-7, T7-8, and T8-9. No significant stenosis within the thoracic spine. CT LUMBAR SPINE FINDINGS Segmentation: Standard. Lowest well-formed disc labeled the L5-S1 level. Alignment: Vertebral bodies normally aligned with preservation of the normal lumbar lordosis. No listhesis or malalignment. Vertebrae: Vertebral body heights maintained without evidence for acute or chronic fracture. Visualized sacrum and pelvis intact. SI joints approximated symmetric. No discrete osseous lesions. Paraspinal and other soft tissues: Paraspinous soft tissues demonstrate no acute abnormality. Moderate aorto bi-iliac atherosclerotic disease without visible aneurysm. Disc levels: Degenerative disc desiccation with mild disc bulging at L4-5. Chronic intervertebral disc space narrowing with diffuse disc bulge and reactive endplate changes at R4-Y7. Moderate facet hypertrophy at L4-5 and L5-S1. No significant stenosis. IMPRESSION: CT THORACIC SPINE IMPRESSION 1. Acute burst type compression fracture involving the T12 vertebral  body with up to 20% central height loss and 3 mm bony retropulsion. No significant stenosis. 2. No other acute traumatic injury within the  thoracic spine. CT LUMBAR SPINE IMPRESSION 1. No acute traumatic injury within the lumbar spine. 2. Mild moderate degenerate spondylolysis and facet arthrosis at L4-5 and L5-S1 without significant stenosis. Electronically Signed   By: Jeannine Boga M.D.   On: 07/31/2018 22:24    Positive ROS: All other systems have been reviewed and were otherwise negative with the exception of those mentioned in the HPI and as above.  Physical Exam: General: Well developed, well nourished female seen in no acute distress. Neurologic: Awake, alert, and oriented. Sensory function is grossly intact. Motor strength is felt to be 5 over 5 bilaterally. No clonus or tremor. Good motor coordination.  MUSCULOSKELETAL: Examination of the spine demonstrates point tenderness to the mid back.  Some paraspinous spasm.  Flip test is negative.  Assessment: T12 compression fracture  Plan: The findings were discussed with the patient.  I have ordered an MRI of the thoracic spine to assist with the decision making regarding any potential kyphoplasty.  The patient has a longstanding relationship with Dr. Gladstone Lighter in [place] and would like to be referred back to him for definitive care.  If the patient's pain can be well controlled, I would suggest arranging follow-up with Dr. Gladstone Lighter.  Davina Howlett P. Holley Bouche M.D.

## 2018-08-02 DIAGNOSIS — M4854XA Collapsed vertebra, not elsewhere classified, thoracic region, initial encounter for fracture: Secondary | ICD-10-CM | POA: Diagnosis not present

## 2018-08-02 LAB — GLUCOSE, CAPILLARY
Glucose-Capillary: 137 mg/dL — ABNORMAL HIGH (ref 70–99)
Glucose-Capillary: 153 mg/dL — ABNORMAL HIGH (ref 70–99)

## 2018-08-02 LAB — BILIRUBIN, TOTAL: Total Bilirubin: 2.4 mg/dL — ABNORMAL HIGH (ref 0.3–1.2)

## 2018-08-02 MED ORDER — METOCLOPRAMIDE HCL 5 MG PO TABS: 5.0000 mg | ORAL_TABLET | Freq: Three times a day (TID) | ORAL | 0 refills | Status: DC

## 2018-08-02 NOTE — Progress Notes (Signed)
Discharge instructions and med details reviewed with patient. Pt verbalized understanding. Printed prescription for raglan and tramadol given to patient as well as printed AVS. IV removed. Patient escorted out via wheelchair.   Wynema Birch, RN

## 2018-08-02 NOTE — Discharge Summary (Signed)
[facility] NAME: Wanda Baird    MR#:  [id number]  DATE OF BIRTH:  1927-05-06  DATE OF ADMISSION:  07/31/2018   ADMITTING PHYSICIAN: Christel Mormon, MD  DATE OF DISCHARGE: 08/01/2018  PRIMARY CARE PHYSICIAN: Derinda Late, MD   ADMISSION DIAGNOSIS:  Fall, initial encounter [W19.XXXA] DISCHARGE DIAGNOSIS:  Active Problems:   Fall  SECONDARY DIAGNOSIS:   Past Medical History:  Diagnosis Date  . Diabetes mellitus without complication ([place])   . Hypertension    HOSPITAL COURSE:  1. T12 compression fracture Control with tramadol.  MRI of the thoracic spine report T2 10% compression fracture. PT evaluation suggest rolling walker without PT follow-up. Per Dr.Hooten, the patient can follow-up with her primary  orthopedic in [place] in one week,same explained to patient,her back pain is well controlled with tylenol as needed,she an do the same at home 2.  Intractable nausea Improved, she is treated with IV antiemetic  3. dehydration Improved with IV fluids.  4.  Diabetes mellitus - Moderate sliding scale insulin, blood sugars controlled.continue home dose diabetic medicine  5.  Hypertension Blood pressure is controlled.can restart her home bp meds  .told  Her to avoid working out side on  Hot and humid weather  As it can cause   Dehydration.  Hyponatremia.  Improved with normal saline IV. Elevated bilirubin.  LFTs normal.  Follow-up daily level with PCP as outpatient. DISCHARGE CONDITIONS:  Stable, discharge to home today. CONSULTS OBTAINED:  Treatment Team:  Dereck Leep, MD DRUG ALLERGIES:   Allergies  Allergen Reactions  . Metformin Diarrhea  . Codeine     REACTION: nausea and vomiting  . Percocet [Oxycodone-Acetaminophen] Nausea Only   DISCHARGE MEDICATIONS:   Allergies as of 08/02/2018      Reactions   Metformin Diarrhea   Codeine    REACTION: nausea and vomiting   Percocet  [oxycodone-acetaminophen] Nausea Only      Medication List    STOP taking these medications   ondansetron 4 MG tablet Commonly known as:  Zofran     TAKE these medications   aspirin EC 81 MG tablet Take 81 mg by mouth daily.   glimepiride 1 MG tablet Commonly known as:  AMARYL Take 1-2 mg by mouth 2 (two) times a day.   levothyroxine 88 MCG tablet Commonly known as:  SYNTHROID Take 88 mcg by mouth daily.   metoCLOPramide 5 MG tablet Commonly known as:  Reglan Take 1 tablet (5 mg total) by mouth 3 (three) times daily for 10 days.   rosuvastatin 5 MG tablet Commonly known as:  CRESTOR Take 5 mg by mouth every other day.   traMADol 50 MG tablet Commonly known as:  ULTRAM Take 1 tablet (50 mg total) by mouth every 6 (six) hours as needed for moderate pain.   valsartan-hydrochlorothiazide 320-25 MG tablet Commonly known as:  DIOVAN-HCT Take 1 tablet by mouth daily.            Durable Medical Equipment  (From admission, onward)         Start     Ordered   08/01/18 1451  For home use only DME Walker rolling  Once    Question:  Patient needs a walker to treat with the following condition  Answer:  Thoracic spine fracture ([place])   08/01/18 1451           DISCHARGE INSTRUCTIONS:  See AVS.  If you experience worsening  of your admission symptoms, develop shortness of breath, life threatening emergency, suicidal or homicidal thoughts you must seek medical attention immediately by calling 911 or calling your MD immediately  if symptoms less severe.  You Must read complete instructions/literature along with all the possible adverse reactions/side effects for all the Medicines you take and that have been prescribed to you. Take any new Medicines after you have completely understood and accpet all the possible adverse reactions/side effects.   Please note  You were cared for by a hospitalist during your hospital stay. If you have any questions about your discharge  medications or the care you received while you were in the hospital after you are discharged, you can call the unit and asked to speak with the hospitalist on call if the hospitalist that took care of you is not available. Once you are discharged, your primary care physician will handle any further medical issues. Please note that NO REFILLS for any discharge medications will be authorized once you are discharged, as it is imperative that you return to your primary care physician (or establish a relationship with a primary care physician if you do not have one) for your aftercare needs so that they can reassess your need for medications and monitor your lab values.    On the day of Discharge:  VITAL SIGNS:  Blood pressure (!) 142/83, pulse 81, temperature 98.4 F (36.9 C), temperature source Oral, resp. rate 16, height 5\' 4"  (1.626 m), weight 69.6 kg, SpO2 96 %. PHYSICAL EXAMINATION:  GENERAL:  85 y.o.-year-old patient lying in the bed with no acute distress.  EYES: Pupils equal, round, reactive to light and accommodation. No scleral icterus. Extraocular muscles intact.  HEENT: Head atraumatic, normocephalic. Oropharynx and nasopharynx clear.  NECK:  Supple, no jugular venous distention. No thyroid enlargement, no tenderness.  LUNGS: Normal breath sounds bilaterally, no wheezing, rales,rhonchi or crepitation. No use of accessory muscles of respiration.  CARDIOVASCULAR: S1, S2 normal. No murmurs, rubs, or gallops.  ABDOMEN: Soft, non-tender, non-distended. Bowel sounds present. No organomegaly or mass.  EXTREMITIES: No pedal edema, cyanosis, or clubbing.  NEUROLOGIC: Cranial nerves II through XII are intact. Muscle strength 5/5 in all extremities. Sensation intact. Gait not checked.  PSYCHIATRIC: The patient is alert and oriented x 3.  SKIN: No obvious rash, lesion, or ulcer.  DATA REVIEW:   CBC Recent Labs  Lab 08/01/18 0353  WBC 9.8  HGB 12.5  HCT 36.3  PLT 153    Chemistries   Recent Labs  Lab 07/31/18 2115 08/01/18 0353 08/02/18 0404  NA 130* 135  --   K 3.8 3.6  --   CL 95* 101  --   CO2 23 26  --   GLUCOSE 216* 175*  --   BUN 20 15  --   CREATININE 0.58 0.59  --   CALCIUM 9.0 8.8*  --   AST 26  --   --   ALT 22  --   --   ALKPHOS 56  --   --   BILITOT 2.2*  --  2.4*     Microbiology Results  Results for orders placed or performed during the hospital encounter of 07/31/18  SARS Coronavirus 2 (CEPHEID - Performed in [facility] hospital lab), Hosp Order     Status: None   Collection Time: 08/01/18  2:18 AM  Result Value Ref Range Status   SARS Coronavirus 2 NEGATIVE NEGATIVE Final    Comment: (NOTE) If result is NEGATIVE SARS-CoV-2  target nucleic acids are NOT DETECTED. The SARS-CoV-2 RNA is generally detectable in upper and lower  respiratory specimens during the acute phase of infection. The lowest  concentration of SARS-CoV-2 viral copies this assay can detect is 250  copies / mL. A negative result does not preclude SARS-CoV-2 infection  and should not be used as the sole basis for treatment or other  patient management decisions.  A negative result may occur with  improper specimen collection / handling, submission of specimen other  than nasopharyngeal swab, presence of viral mutation(s) within the  areas targeted by this assay, and inadequate number of viral copies  (<250 copies / mL). A negative result must be combined with clinical  observations, patient history, and epidemiological information. If result is POSITIVE SARS-CoV-2 target nucleic acids are DETECTED. The SARS-CoV-2 RNA is generally detectable in upper and lower  respiratory specimens dur ing the acute phase of infection.  Positive  results are indicative of active infection with SARS-CoV-2.  Clinical  correlation with patient history and other diagnostic information is  necessary to determine patient infection status.  Positive results do  not rule out bacterial  infection or co-infection with other viruses. If result is PRESUMPTIVE POSTIVE SARS-CoV-2 nucleic acids MAY BE PRESENT.   A presumptive positive result was obtained on the submitted specimen  and confirmed on repeat testing.  While 2019 novel coronavirus  (SARS-CoV-2) nucleic acids may be present in the submitted sample  additional confirmatory testing may be necessary for epidemiological  and / or clinical management purposes  to differentiate between  SARS-CoV-2 and other Sarbecovirus currently known to infect humans.  If clinically indicated additional testing with an alternate test  methodology [phone]) is advised. The SARS-CoV-2 RNA is generally  detectable in upper and lower respiratory sp ecimens during the acute  phase of infection. The expected result is Negative. Fact Sheet for Patients:  StrictlyIdeas.no Fact Sheet for Healthcare Providers: BankingDealers.co.za This test is not yet approved or cleared by the [place] FDA and has been authorized for detection and/or diagnosis of SARS-CoV-2 by FDA under an Emergency Use Authorization (EUA).  This EUA will remain in effect (meaning this test can be used) for the duration of the COVID-19 declaration under Section 564(b)(1) of the Act, 21 U.S.C. section 360bbb-3(b)(1), unless the authorization is terminated or revoked sooner. Performed at [facility], [address], [place], [place] [address]     RADIOLOGY:  Mr Thoracic Spine Wo Contrast  Result Date: 08/01/2018 CLINICAL DATA:  Severe back pain and nausea. Slipped and fell while coming into the house. EXAM: MRI THORACIC SPINE WITHOUT CONTRAST TECHNIQUE: Multiplanar, multisequence MR imaging of the thoracic spine was performed. No intravenous contrast was administered. COMPARISON:  None. FINDINGS: Alignment:  Physiologic. Vertebrae: T12 vertebral body compression fracture with 10% height loss and marrow edema within  the vertebral body with 1 mm of retropulsion of the superior posterior margin. Remainder the vertebral body heights are maintained. No other fracture. No discitis or osteomyelitis. No aggressive osseous lesion. Cord:  Normal signal and morphology. Paraspinal and other soft tissues: No acute paraspinal abnormality. Disc levels: Disc spaces:  Disc spaces are maintained. T1-T2: No disc protrusion, foraminal stenosis or central canal stenosis. T2-T3: Small central disc protrusion. No foraminal or central canal stenosis. T3-T4: No disc protrusion, foraminal stenosis or central canal stenosis. T4-T5: No disc protrusion, foraminal stenosis or central canal stenosis. T5-T6: No disc protrusion, foraminal stenosis or central canal stenosis. T6-T7: No disc protrusion, foraminal stenosis  or central canal stenosis. T7-T8: Tiny right paracentral disc protrusion. No foraminal or central canal stenosis. T8-T9: No disc protrusion, foraminal stenosis or central canal stenosis. T9-T10: No disc protrusion, foraminal stenosis or central canal stenosis. T10-T11: No disc protrusion, foraminal stenosis or central canal stenosis. T11-T12: No disc protrusion, foraminal stenosis or central canal stenosis. IMPRESSION: 1. Acute T12 vertebral body compression fracture with approximately 10% height loss. Electronically Signed   By: Kathreen Devoid   On: 08/01/2018 12:08     Management plans discussed with the patient, family and they are in agreement.  CODE STATUS: Full Code   TOTAL TIME TAKING CARE OF THIS PATIENT: 32 minutes.    Epifanio Lesches M.D on 08/02/2018 at 11:14 AM  Between 7am to 6pm - Pager - [phone]  After 6pm go to [email or web address] - Proofreader  [facility]  Office  [phone]  CC: Primary care physician; Derinda Late, MD   Note: This dictation was prepared with Dragon dictation along with smaller phrase technology. Any transcriptional errors that result from this  process are unintentional.

## 2018-08-26 DIAGNOSIS — S22001A Stable burst fracture of unspecified thoracic vertebra, initial encounter for closed fracture: Secondary | ICD-10-CM | POA: Insufficient documentation

## 2018-11-10 ENCOUNTER — Ambulatory Visit
Admission: RE | Admit: 2018-11-10 | Discharge: 2018-11-10 | Disposition: A | Discharge status: Home / Self Care | Payer: Medicare Other | Source: Ambulatory Visit | Attending: Family Medicine | Admitting: Family Medicine

## 2018-11-10 DIAGNOSIS — Z1231 Encounter for screening mammogram for malignant neoplasm of breast: Secondary | ICD-10-CM

## 2019-07-02 ENCOUNTER — Other Ambulatory Visit: Payer: Self-pay

## 2019-07-02 ENCOUNTER — Encounter: Payer: Self-pay | Admitting: Dermatology

## 2019-07-02 ENCOUNTER — Ambulatory Visit (INDEPENDENT_AMBULATORY_CARE_PROVIDER_SITE_OTHER): Payer: Medicare Other | Admitting: Dermatology

## 2019-07-02 DIAGNOSIS — Z85828 Personal history of other malignant neoplasm of skin: Secondary | ICD-10-CM | POA: Diagnosis not present

## 2019-07-02 DIAGNOSIS — L820 Inflamed seborrheic keratosis: Secondary | ICD-10-CM | POA: Diagnosis not present

## 2019-07-02 DIAGNOSIS — L821 Other seborrheic keratosis: Secondary | ICD-10-CM | POA: Diagnosis not present

## 2019-07-02 DIAGNOSIS — L578 Other skin changes due to chronic exposure to nonionizing radiation: Secondary | ICD-10-CM

## 2019-07-02 DIAGNOSIS — D0471 Carcinoma in situ of skin of right lower limb, including hip: Secondary | ICD-10-CM | POA: Diagnosis not present

## 2019-07-02 DIAGNOSIS — D492 Neoplasm of unspecified behavior of bone, soft tissue, and skin: Secondary | ICD-10-CM

## 2019-07-02 DIAGNOSIS — C44722 Squamous cell carcinoma of skin of right lower limb, including hip: Secondary | ICD-10-CM

## 2019-07-02 NOTE — Progress Notes (Addendum)
Follow-Up Visit   Subjective  Wanda Baird is a 86 y.o. female who presents for the following: check spots (R foot x 3, hx LN2 09/2018). She also has spots on her legs she would like checked.  The following portions of the chart were reviewed this encounter and updated as appropriate:  Tobacco  Allergies  Meds  Problems  Med Hx  Surg Hx  Fam Hx      Review of Systems:  No other skin or systemic complaints except as noted in HPI or Assessment and Plan.  Objective  Well appearing patient in no apparent distress; mood and affect are within normal limits.  A focused examination was performed including bil feet. Relevant physical exam findings are noted in the Assessment and Plan.  Objective  R dorsum foot x 1, R post ankle x 1, L post ankle x 1 (3): Erythematous keratotic or waxy stuck-on papule or plaque.   Objective  R lat foot anterior: 0.5cm hyperkeratotic pap  Objective  R lateral foot middle: 0.8cm hyperkeratotic pap  Objective  R lat foot posterior: 0.7cm hyperkeratotic pap   Assessment & Plan   Actinic Damage - diffuse scaly erythematous macules with underlying dyspigmentation - Recommend daily broad spectrum sunscreen SPF 30+ to sun-exposed areas, reapply every 2 hours as needed.  - Call for new or changing lesions.  Seborrheic Keratoses - Stuck-on, waxy, tan-brown papules and plaques  - Discussed benign etiology and prognosis. - Observe - Call for any changes  History of Skin Cancer  SCC/SCCIS Clear. Observe for recurrence. Call clinic for new or changing lesions.  Recommend regular skin exams, daily broad-spectrum spf 30+ sunscreen use, and photoprotection.     Inflamed seborrheic keratosis (3) R dorsum foot x 1, R post ankle x 1, L post ankle x 1  Destruction of lesion - R dorsum foot x 1, R post ankle x 1, L post ankle x 1 Complexity: simple   Destruction method: cryotherapy   Informed consent: discussed and consent obtained     Timeout:  patient name, date of birth, surgical site, and procedure verified Lesion destroyed using liquid nitrogen: Yes   Region frozen until ice ball extended beyond lesion: Yes   Outcome: patient tolerated procedure well with no complications   Post-procedure details: wound care instructions given    Neoplasm of skin (3) R lat foot anterior  Epidermal / dermal shaving  Lesion length (cm):  0.5 Lesion width (cm):  0.5 Margin per side (cm):  0.3 Total excision diameter (cm):  1.1 Informed consent: discussed and consent obtained   Timeout: patient name, date of birth, surgical site, and procedure verified   Procedure prep:  Patient was prepped and draped in usual sterile fashion Prep type:  Isopropyl alcohol Anesthesia: the lesion was anesthetized in a standard fashion   Anesthetic:  1% lidocaine w/ epinephrine 1-100,000 buffered w/ 8.4% NaHCO3 Instrument used: flexible razor blade   Hemostasis achieved with: pressure, aluminum chloride and electrodesiccation   Outcome: patient tolerated procedure well   Post-procedure details: sterile dressing applied and wound care instructions given   Dressing type: bandage and petrolatum    Destruction of lesion Complexity: extensive   Destruction method: electrodesiccation and curettage   Informed consent: discussed and consent obtained   Timeout:  patient name, date of birth, surgical site, and procedure verified Procedure prep:  Patient was prepped and draped in usual sterile fashion Prep type:  Isopropyl alcohol Anesthesia: the lesion was anesthetized in a standard fashion  Anesthetic:  1% lidocaine w/ epinephrine 1-100,000 buffered w/ 8.4% NaHCO3 Curettage performed in three different directions: Yes   Electrodesiccation performed over the curetted area: Yes   Lesion length (cm):  0.5 Lesion width (cm):  0.5 Margin per side (cm):  0.3 Final wound size (cm):  1.1 Hemostasis achieved with:  pressure, aluminum chloride and  electrodesiccation Outcome: patient tolerated procedure well with no complications   Post-procedure details: sterile dressing applied and wound care instructions given   Dressing type: bandage and petrolatum    Specimen 1 - Surgical pathology Differential Diagnosis: D48.5 SCC vs other Check Margins: No 0.5cm hyperkeratotic pap EDC today  R lateral foot middle  Epidermal / dermal shaving  Lesion length (cm):  0.8 Lesion width (cm):  0.8 Margin per side (cm):  0.3 Total excision diameter (cm):  1.4 Informed consent: discussed and consent obtained   Timeout: patient name, date of birth, surgical site, and procedure verified   Procedure prep:  Patient was prepped and draped in usual sterile fashion Prep type:  Isopropyl alcohol Anesthesia: the lesion was anesthetized in a standard fashion   Anesthetic:  1% lidocaine w/ epinephrine 1-100,000 buffered w/ 8.4% NaHCO3 Instrument used: flexible razor blade   Hemostasis achieved with: pressure, aluminum chloride and electrodesiccation   Outcome: patient tolerated procedure well   Post-procedure details: sterile dressing applied and wound care instructions given   Dressing type: bandage and petrolatum    Destruction of lesion Complexity: extensive   Destruction method: electrodesiccation and curettage   Informed consent: discussed and consent obtained   Timeout:  patient name, date of birth, surgical site, and procedure verified Procedure prep:  Patient was prepped and draped in usual sterile fashion Prep type:  Isopropyl alcohol Anesthesia: the lesion was anesthetized in a standard fashion   Anesthetic:  1% lidocaine w/ epinephrine 1-100,000 buffered w/ 8.4% NaHCO3 Curettage performed in three different directions: Yes   Electrodesiccation performed over the curetted area: Yes   Lesion length (cm):  0.8 Lesion width (cm):  0.8 Margin per side (cm):  0.3 Final wound size (cm):  1.4 Hemostasis achieved with:  pressure, aluminum  chloride and electrodesiccation Outcome: patient tolerated procedure well with no complications   Post-procedure details: sterile dressing applied and wound care instructions given   Dressing type: bandage and petrolatum    Specimen 2 - Surgical pathology Differential Diagnosis: D48.5 SCC vs other Check Margins: No 0.8cm hyperkeratotic pap EDC today  R lat foot posterior  Epidermal / dermal shaving  Lesion length (cm):  0.7 Lesion width (cm):  0.7 Margin per side (cm):  0.3 Total excision diameter (cm):  1.3 Informed consent: discussed and consent obtained   Timeout: patient name, date of birth, surgical site, and procedure verified   Procedure prep:  Patient was prepped and draped in usual sterile fashion Prep type:  Isopropyl alcohol Anesthesia: the lesion was anesthetized in a standard fashion   Anesthetic:  1% lidocaine w/ epinephrine 1-100,000 buffered w/ 8.4% NaHCO3 Instrument used: flexible razor blade   Hemostasis achieved with: pressure, aluminum chloride and electrodesiccation   Outcome: patient tolerated procedure well   Post-procedure details: sterile dressing applied and wound care instructions given   Dressing type: bandage and petrolatum    Destruction of lesion Complexity: extensive   Destruction method: electrodesiccation and curettage   Informed consent: discussed and consent obtained   Timeout:  patient name, date of birth, surgical site, and procedure verified Procedure prep:  Patient was prepped and draped in usual  sterile fashion Prep type:  Isopropyl alcohol Anesthesia: the lesion was anesthetized in a standard fashion   Anesthetic:  1% lidocaine w/ epinephrine 1-100,000 buffered w/ 8.4% NaHCO3 Curettage performed in three different directions: Yes   Electrodesiccation performed over the curetted area: Yes   Lesion length (cm):  0.7 Lesion width (cm):  0.7 Margin per side (cm):  0.3 Final wound size (cm):  1.3 Hemostasis achieved with:  pressure,  aluminum chloride and electrodesiccation Outcome: patient tolerated procedure well with no complications   Post-procedure details: sterile dressing applied and wound care instructions given   Dressing type: bandage and petrolatum    Specimen 3 - Surgical pathology Differential Diagnosis: D48.5 SCC vs other Check Margins: No 0.7cm hyperkeratotic pap EDC today  SCC (squamous cell carcinoma), foot, right  Return in about 6 months (around 01/02/2020) for TBSE.   I, Othelia Pulling, RMA, am acting as scribe for Sarina Ser, MD .  Documentation: I have reviewed the above documentation for accuracy and completeness, and I agree with the above.  Sarina Ser, MD

## 2019-07-02 NOTE — Patient Instructions (Signed)

## 2019-07-03 ENCOUNTER — Encounter: Payer: Self-pay | Admitting: Dermatology

## 2019-07-08 ENCOUNTER — Telehealth: Payer: Self-pay

## 2019-07-08 NOTE — Telephone Encounter (Signed)
LM on VM please return my call to discuss biopsy results  

## 2019-07-08 NOTE — Telephone Encounter (Signed)
Discussed biopsy results with pt  °

## 2019-12-30 ENCOUNTER — Other Ambulatory Visit: Payer: Self-pay

## 2019-12-30 ENCOUNTER — Ambulatory Visit (INDEPENDENT_AMBULATORY_CARE_PROVIDER_SITE_OTHER): Payer: Medicare Other | Admitting: Dermatology

## 2019-12-30 ENCOUNTER — Encounter: Payer: Self-pay | Admitting: Dermatology

## 2019-12-30 DIAGNOSIS — D1800 Hemangioma unspecified site: Secondary | ICD-10-CM

## 2019-12-30 DIAGNOSIS — Z85828 Personal history of other malignant neoplasm of skin: Secondary | ICD-10-CM

## 2019-12-30 DIAGNOSIS — L821 Other seborrheic keratosis: Secondary | ICD-10-CM

## 2019-12-30 DIAGNOSIS — Z1283 Encounter for screening for malignant neoplasm of skin: Secondary | ICD-10-CM

## 2019-12-30 DIAGNOSIS — Z86018 Personal history of other benign neoplasm: Secondary | ICD-10-CM

## 2019-12-30 DIAGNOSIS — L578 Other skin changes due to chronic exposure to nonionizing radiation: Secondary | ICD-10-CM

## 2019-12-30 DIAGNOSIS — Z8619 Personal history of other infectious and parasitic diseases: Secondary | ICD-10-CM

## 2019-12-30 DIAGNOSIS — Z872 Personal history of diseases of the skin and subcutaneous tissue: Secondary | ICD-10-CM

## 2019-12-30 DIAGNOSIS — L814 Other melanin hyperpigmentation: Secondary | ICD-10-CM

## 2019-12-30 DIAGNOSIS — D229 Melanocytic nevi, unspecified: Secondary | ICD-10-CM

## 2019-12-30 MED ORDER — MUPIROCIN 2 % EX OINT: 1.0000 "application " | TOPICAL_OINTMENT | Freq: Every day | CUTANEOUS | 6 refills | Status: DC

## 2019-12-30 NOTE — Progress Notes (Signed)
   Follow-Up Visit   Subjective  Wanda Baird is a 86 y.o. female who presents for the following: Annual Exam (Total body skin exam, hx of SCC, Dysplastic nevus). The patient presents for Total-Body Skin Exam (TBSE) for skin cancer screening and mole check.  The following portions of the chart were reviewed this encounter and updated as appropriate:  Tobacco  Allergies  Meds  Problems  Med Hx  Surg Hx  Fam Hx     Review of Systems:  No other skin or systemic complaints except as noted in HPI or Assessment and Plan.  Objective  Well appearing patient in no apparent distress; mood and affect are within normal limits.  A full examination was performed including scalp, head, eyes, ears, nose, lips, neck, chest, axillae, abdomen, back, buttocks, bilateral upper extremities, bilateral lower extremities, hands, feet, fingers, toes, fingernails, and toenails. All findings within normal limits unless otherwise noted below.  Objective  Left Lower Leg - Anterior: Clear today  Objective  face: Face clear today  Objective  Right Ear: Clear today   Assessment & Plan    Lentigines - Scattered tan macules - Discussed due to sun exposure - Benign, observe - Call for any changes  Seborrheic Keratoses - Stuck-on, waxy, tan-brown papules and plaques  - Discussed benign etiology and prognosis. - Observe - Call for any changes  Melanocytic Nevi - Tan-brown and/or pink-flesh-colored symmetric macules and papules - Benign appearing on exam today - Observation - Call clinic for new or changing moles - Recommend daily use of broad spectrum spf 30+ sunscreen to sun-exposed areas.   Hemangiomas - Red papules - Discussed benign nature - Observe - Call for any changes  Actinic Damage - chronic, secondary to cumulative UV exposure - diffuse scaly erythematous macules with underlying dyspigmentation - Recommend daily broad spectrum sunscreen SPF 30+ to sun-exposed areas,  reapply every 2 hours as needed.  - Call for new or changing lesions.  Skin cancer screening performed today.  History of Squamous Cell Carcinoma of the Skin - No evidence of recurrence today - No lymphadenopathy - Recommend regular full body skin exams - Recommend daily broad spectrum sunscreen SPF 30+ to sun-exposed areas, reapply every 2 hours as needed.  - Call if any new or changing lesions are noted between office visits - R dorsum foot, R dorsal hand  History of Dysplastic Nevi - No evidence of recurrence today - Recommend regular full body skin exams - Recommend daily broad spectrum sunscreen SPF 30+ to sun-exposed areas, reapply every 2 hours as needed.  - Call if any new or changing lesions are noted between office visits -R distal lat thigh  History of cellulitis Left Lower Leg - Anterior With skin injuries Cont Mupirocin oint qd prn for scrapes and cuts and scratches  mupirocin ointment (BACTROBAN) 2 % - Left Lower Leg - Anterior  History of actinic keratoses face Clear, observe for changes   History of shingles Right Ear Resolved with Valtrex  Return in about 1 year (around 12/29/2020) for TBSE, hx of SCC, Dysplastic,AK.  I, Othelia Pulling, RMA, am acting as scribe for Sarina Ser, MD .  Documentation: I have reviewed the above documentation for accuracy and completeness, and I agree with the above.  Sarina Ser, MD

## 2020-05-26 ENCOUNTER — Emergency Department: Payer: Medicare Other

## 2020-05-26 ENCOUNTER — Other Ambulatory Visit: Payer: Self-pay

## 2020-05-26 ENCOUNTER — Emergency Department
Admission: EM | Admit: 2020-05-26 | Discharge: 2020-05-26 | Disposition: A | Discharge status: Home / Self Care | Payer: Medicare Other | Attending: Emergency Medicine | Admitting: Emergency Medicine

## 2020-05-26 DIAGNOSIS — Z7984 Long term (current) use of oral hypoglycemic drugs: Secondary | ICD-10-CM | POA: Diagnosis not present

## 2020-05-26 DIAGNOSIS — R000 Tachycardia, unspecified: Secondary | ICD-10-CM | POA: Insufficient documentation

## 2020-05-26 DIAGNOSIS — I10 Essential (primary) hypertension: Secondary | ICD-10-CM | POA: Diagnosis not present

## 2020-05-26 DIAGNOSIS — E039 Hypothyroidism, unspecified: Secondary | ICD-10-CM | POA: Insufficient documentation

## 2020-05-26 DIAGNOSIS — E119 Type 2 diabetes mellitus without complications: Secondary | ICD-10-CM | POA: Diagnosis not present

## 2020-05-26 DIAGNOSIS — R112 Nausea with vomiting, unspecified: Secondary | ICD-10-CM | POA: Diagnosis present

## 2020-05-26 DIAGNOSIS — Z20822 Contact with and (suspected) exposure to covid-19: Secondary | ICD-10-CM | POA: Insufficient documentation

## 2020-05-26 DIAGNOSIS — E860 Dehydration: Secondary | ICD-10-CM

## 2020-05-26 DIAGNOSIS — Z79899 Other long term (current) drug therapy: Secondary | ICD-10-CM | POA: Insufficient documentation

## 2020-05-26 LAB — COMPREHENSIVE METABOLIC PANEL
ALT: 19 U/L (ref 0–44)
AST: 21 U/L (ref 15–41)
Albumin: 4.5 g/dL (ref 3.5–5.0)
Alkaline Phosphatase: 43 U/L (ref 38–126)
Anion gap: 13 (ref 5–15)
BUN: 20 mg/dL (ref 8–23)
CO2: 25 mmol/L (ref 22–32)
Calcium: 9.5 mg/dL (ref 8.9–10.3)
Chloride: 100 mmol/L (ref 98–111)
Creatinine, Ser: 0.61 mg/dL (ref 0.44–1.00)
GFR, Estimated: >60 mL/min (ref >60)
Glucose, Bld: 217 mg/dL — ABNORMAL HIGH (ref 70–99)
Potassium: 3.6 mmol/L (ref 3.5–5.1)
Sodium: 138 mmol/L (ref 135–145)
Total Bilirubin: 1.5 mg/dL — ABNORMAL HIGH (ref 0.3–1.2)
Total Protein: 7.1 g/dL (ref 6.5–8.1)

## 2020-05-26 LAB — CBC WITH DIFFERENTIAL/PLATELET
Abs Immature Granulocytes: 0.07 10*3/uL (ref 0.00–0.07)
Basophils Absolute: 0 10*3/uL (ref 0.0–0.1)
Basophils Relative: 0 %
Eosinophils Absolute: 0 10*3/uL (ref 0.0–0.5)
Eosinophils Relative: 0 %
HCT: 40.7 % (ref 36.0–46.0)
Hemoglobin: 14.4 g/dL (ref 12.0–15.0)
Immature Granulocytes: 0 %
Lymphocytes Relative: 9 %
Lymphs Abs: 1.4 10*3/uL (ref 0.7–4.0)
MCH: 30.6 pg (ref 26.0–34.0)
MCHC: 35.4 g/dL (ref 30.0–36.0)
MCV: 86.6 fL (ref 80.0–100.0)
Monocytes Absolute: 0.9 10*3/uL (ref 0.1–1.0)
Monocytes Relative: 6 %
Neutro Abs: 13.5 10*3/uL — ABNORMAL HIGH (ref 1.7–7.7)
Neutrophils Relative %: 85 %
Platelets: 212 10*3/uL (ref 150–400)
RBC: 4.7 MIL/uL (ref 3.87–5.11)
RDW: 11.9 % (ref 11.5–15.5)
WBC: 15.9 10*3/uL — ABNORMAL HIGH (ref 4.0–10.5)
nRBC: 0 % (ref 0.0–0.2)

## 2020-05-26 LAB — RESP PANEL BY RT-PCR (FLU A&B, COVID) ARPGX2
Influenza A by PCR: NEGATIVE
Influenza B by PCR: NEGATIVE
SARS Coronavirus 2 by RT PCR: NEGATIVE

## 2020-05-26 LAB — APTT: aPTT: 31 seconds (ref 24–36)

## 2020-05-26 LAB — TROPONIN I (HIGH SENSITIVITY): Troponin I (High Sensitivity): 8 ng/L (ref <18)

## 2020-05-26 LAB — CBG MONITORING, ED: Glucose-Capillary: 230 mg/dL — ABNORMAL HIGH (ref 70–99)

## 2020-05-26 LAB — PROTIME-INR
INR: 1 (ref 0.8–1.2)
Prothrombin Time: 13.2 seconds (ref 11.4–15.2)

## 2020-05-26 LAB — LACTIC ACID, PLASMA: Lactic Acid, Venous: 1.4 mmol/L (ref 0.5–1.9)

## 2020-05-26 MED ORDER — LACTATED RINGERS IV BOLUS
1000.0000 mL | Freq: Once | INTRAVENOUS | Status: AC
  Administered 2020-05-26: 1000 mL via INTRAVENOUS

## 2020-05-26 MED ORDER — ONDANSETRON HCL 4 MG PO TABS: 4.0000 mg | ORAL_TABLET | Freq: Three times a day (TID) | ORAL | 0 refills | Status: DC | PRN

## 2020-05-26 MED ORDER — ONDANSETRON HCL 4 MG/2ML IJ SOLN
4.0000 mg | Freq: Once | INTRAMUSCULAR | Status: AC
  Administered 2020-05-26: 4 mg via INTRAVENOUS
  Filled 2020-05-26: qty 2

## 2020-05-26 MED ORDER — INSULIN ASPART 100 UNIT/ML ~~LOC~~ SOLN: 0.0000 [IU] | SUBCUTANEOUS | Status: DC

## 2020-05-26 NOTE — ED Triage Notes (Signed)
Pt states she got her 2nd booster shot for covid today and a few hours after started to have emesis and diarrhea. Pt denies abd pain.

## 2020-05-26 NOTE — ED Provider Notes (Signed)
[facility] Emergency Department Provider Note  ____________________________________________   Event Date/Time   First MD Initiated Contact with Patient 05/26/20 2136     (approximate)  I have reviewed the triage vital signs and the nursing notes.   HISTORY  Chief Complaint Emesis and Diarrhea   HPI Wanda Baird is a 87 y.o. female with a past medical history of DM, HTN, hypothyroidism, osteoporosis HDL who presents for assessment of acute onset of some nonbloody nonbilious vomiting and diarrhea that began earlier this afternoon after she received Covid booster shot earlier in the day.  She states she has been having some vomiting and diarrhea.  She denies any other acute sick symptoms including fevers, chills, cough, shortness of breath, chest pain, back pain, abdominal pain, urinary symptoms, headache, earache, sore throat, dizziness, rash or any other acute sick symptoms.  Denies any EtOH use, drug use or tobacco abuse.  She states she thinks this is all related to her Covid shot.  No other acute complaints at this time         Past Medical History:  Diagnosis Date  . Actinic keratosis   . Diabetes mellitus without complication ([place])   . Hx of dysplastic nevus 06/29/2008   R distal lateral thigh, mild to moderate  . Hypertension   . Squamous cell carcinoma of skin 05/07/2016   R dorsum foot  . Squamous cell carcinoma of skin 12/24/2013   SCC IS R dorsal hand    Patient Active Problem List   Diagnosis Date Noted  . Fall 07/31/2018  . HYPOTHYROIDISM 10/29/2006  . DIABETES-TYPE 2 10/29/2006  . HYPERTENSION, ESSENTIAL NOS 10/29/2006  . OSTEOPOROSIS 10/29/2006    Past Surgical History:  Procedure Laterality Date  . BACK SURGERY      Prior to Admission medications   Medication Sig Start Date End Date Taking? Authorizing Provider  ondansetron (ZOFRAN) 4 MG tablet Take 1 tablet (4 mg total) by mouth every 8 (eight) hours as needed  for up to 10 doses for nausea or vomiting. 05/26/20  Yes Lucrezia Starch, MD  Calcium Carbonate-Vitamin D3 (CALCIUM 600-D) 600-400 MG-UNIT TABS Take 1 tablet by mouth daily.     [provider]  glimepiride (AMARYL) 1 MG tablet Take 1-2 mg by mouth See admin instructions. Take 2mg  by mouth every morning at breakfast and take 1mg  by mouth every evening at dinner 07/23/18   [provider]  levothyroxine (SYNTHROID) 88 MCG tablet Take 88 mcg by mouth daily. 05/14/18   [provider]  metoCLOPramide (REGLAN) 5 MG tablet Take 1 tablet (5 mg total) by mouth 3 (three) times daily for 10 days. 08/02/18 08/12/18  Epifanio Lesches, MD  mupirocin ointment (BACTROBAN) 2 % Apply 1 application topically daily. Qd to wounds 12/30/19   Ralene Bathe, MD  rosuvastatin (CRESTOR) 5 MG tablet Take 5 mg by mouth every other day. 03/17/18   [provider]  traMADol (ULTRAM) 50 MG tablet Take 1 tablet (50 mg total) by mouth every 6 (six) hours as needed for moderate pain. 08/01/18   Demetrios Loll, MD  valsartan-hydrochlorothiazide (DIOVAN-HCT) 320-25 MG tablet Take 1 tablet by mouth daily. 07/23/18 07/23/19  [provider]    Allergies Metformin, Codeine, and Percocet [oxycodone-acetaminophen]  Family History  Problem Relation Age of Onset  . Breast cancer Maternal Aunt     Social History Social History   Tobacco Use  . Smoking status: Never Smoker  . Smokeless tobacco: Never Used  Substance Use Topics  . Alcohol use: No  . Drug use: No    Review of Systems  Review of Systems  Constitutional: Positive for malaise/fatigue. Negative for chills and fever.  HENT: Negative for sore throat.   Eyes: Negative for pain.  Respiratory: Negative for cough and stridor.   Cardiovascular: Negative for chest pain.  Gastrointestinal: Positive for diarrhea, nausea and vomiting.  Genitourinary: Negative for dysuria.  Musculoskeletal: Negative for myalgias.  Skin: Negative  for rash.  Neurological: Positive for weakness. Negative for seizures, loss of consciousness and headaches.  Psychiatric/Behavioral: Negative for suicidal ideas.  All other systems reviewed and are negative.     ____________________________________________   PHYSICAL EXAM:  VITAL SIGNS: ED Triage Vitals  Enc Vitals Group     BP 05/26/20 2047 (!) 154/81     Pulse Rate 05/26/20 2047 (!) 115     Resp 05/26/20 2047 18     Temp 05/26/20 2047 98.6 F (37 C)     Temp Source 05/26/20 2047 Oral     SpO2 05/26/20 2047 95 %     Weight 05/26/20 2049 147 lb (66.7 kg)     Height 05/26/20 2049 5\' 4"  (1.626 m)     Head Circumference --      Peak Flow --      Pain Score 05/26/20 2103 0     Pain Loc --      Pain Edu? --      Excl. in [place]? --    Vitals:   05/26/20 2047 05/26/20 2245  BP: (!) 154/81 (!) 141/78  Pulse: (!) 115 89  Resp: 18 16  Temp: 98.6 F (37 C)   SpO2: 95% 98%   Physical Exam Vitals and nursing note reviewed.  Constitutional:      General: She is not in acute distress.    Appearance: She is well-developed. She is ill-appearing.  HENT:     Head: Normocephalic and atraumatic.     Right Ear: External ear normal.     Left Ear: External ear normal.     Nose: Nose normal.     Mouth/Throat:     Mouth: Mucous membranes are dry.  Eyes:     Conjunctiva/sclera: Conjunctivae normal.  Cardiovascular:     Rate and Rhythm: Regular rhythm. Tachycardia present.     Heart sounds: No murmur heard.   Pulmonary:     Effort: Pulmonary effort is normal. No respiratory distress.     Breath sounds: Normal breath sounds.  Abdominal:     Palpations: Abdomen is soft.     Tenderness: There is no abdominal tenderness.  Musculoskeletal:     Cervical back: Neck supple.  Skin:    General: Skin is dry.     Capillary Refill: Capillary refill takes more than 3 seconds.  Neurological:     Mental Status: She is alert and oriented to person, place, and time.  Psychiatric:        Mood  and Affect: Mood normal.      ____________________________________________   LABS (all labs ordered are listed, but only abnormal results are displayed)  Labs Reviewed  CBC WITH DIFFERENTIAL/PLATELET - Abnormal; Notable for the following components:      Result Value   WBC 15.9 (*)    Neutro Abs 13.5 (*)    All other components within normal limits  COMPREHENSIVE METABOLIC PANEL - Abnormal; Notable for the following components:   Glucose, Bld 217 (*)    Total Bilirubin 1.5 (*)  All other components within normal limits  CBG MONITORING, ED - Abnormal; Notable for the following components:   Glucose-Capillary 230 (*)    All other components within normal limits  RESP PANEL BY RT-PCR (FLU A&B, COVID) ARPGX2  LACTIC ACID, PLASMA  PROTIME-INR  APTT  URINALYSIS, COMPLETE (UACMP) WITH MICROSCOPIC  LACTIC ACID, PLASMA  HEMOGLOBIN A1C  TROPONIN I (HIGH SENSITIVITY)  TROPONIN I (HIGH SENSITIVITY)   ____________________________________________  EKG  Sinus rhythm without evidence of acute ischemia or other significant underlying arrhythmia.  ____________________________________________  RADIOLOGY  ED MD interpretation: No full consolidation, effusion pneumothorax, edema or any other clear acute intrathoracic process.   Official radiology report(s): DG Chest 2 View  Result Date: 05/26/2020 CLINICAL DATA:  Emesis, diarrhea EXAM: CHEST - 2 VIEW COMPARISON:  07/31/2018 FINDINGS: Frontal and lateral views of the chest demonstrate an unremarkable cardiac silhouette. Chronic background scarring without airspace disease, effusion, or pneumothorax. Chronic T12 wedge compression deformity. No acute bony abnormalities. IMPRESSION: 1. No acute intrathoracic process. Electronically Signed   By: Randa Ngo M.D.   On: 05/26/2020 22:06    ____________________________________________   PROCEDURES  Procedure(s) performed (including Critical Care):  .1-3 Lead EKG  Interpretation Performed by: Lucrezia Starch, MD Authorized by: Lucrezia Starch, MD     Interpretation: normal     ECG rate assessment: normal     Rhythm: sinus rhythm     Ectopy: none     Conduction: normal       ____________________________________________   INITIAL IMPRESSION / ASSESSMENT AND PLAN / ED COURSE      Patient presents with above to history exam for assessment of acute onset of nonbloody nonbilious vomiting diarrhea that began a couple hours after he received her Covid booster shot earlier in the day.  On arrival she is tachycardic with a heart rate of 115 with otherwise stable vital signs on room air.  She does appear dehydrated but abdomen is soft nontender throughout remainder of her exam is unremarkable.  Differential includes but is not limited to side effect from Covid booster shot, acute infectious gastroenteritis, ACS, arrhythmia, symptomatic anemia, cholecystitis, pancreatitis, cystitis, diverticulitis, and significant metabolic derangements.  Chest x-ray has no evidence of clear acute intrathoracic abnormality i.e. pneumonia.  While ECG and initial troponin are reassuring not consistent with ACS we will plan to obtain a repeat troponin.  CMP shows glucose of 217 with no other significant electrolyte or metabolic derangements.  Patient declined any insulin despite recommendation for this while in the ED.  CBC remarkable for leukocytosis with WBC count of 15.9 but no other significant derangements.  Lactic acid 1.4.  Covid and influenza was negative.  INR is unremarkable.  On my reassessment after patient received some IV fluids and Zofran [facility] felt much better and wished to leave.  Strongly advised her to stay to undergo CT of her abdomen pelvis as well as repeat troponin and continued monitoring given initial tachycardia and elevated white blood cell count and concern for possible serious underlying life-threatening etiology for her symptoms.  She states she  understood she may have life-threatening process including appendicitis, cholecystitis or heart attack unable to see on only 1 troponin but still wished to leave against advice.  He was discharged AMA.  I believe she had capacity to make this decision. ____________________________________________   FINAL CLINICAL IMPRESSION(S) / ED DIAGNOSES  Final diagnoses:  Nausea vomiting and diarrhea  Dehydration    Medications  insulin aspart (novoLOG) injection 0-15 Units (0  Units Subcutaneous Patient Refused/Not Given 05/26/20 2229)  lactated ringers bolus 1,000 mL (0 mLs Intravenous Stopped 05/26/20 2258)  ondansetron (ZOFRAN) injection 4 mg (4 mg Intravenous Given 05/26/20 2223)     ED Discharge Orders         Ordered    ondansetron (ZOFRAN) 4 MG tablet  Every 8 hours PRN        05/26/20 2245           Note:  This document was prepared using Dragon voice recognition software and may include unintentional dictation errors.   Lucrezia Starch, MD 05/26/20 [phone]

## 2020-05-27 LAB — HEMOGLOBIN A1C
Hgb A1c MFr Bld: 8 % — ABNORMAL HIGH (ref 4.8–5.6)
Mean Plasma Glucose: 182.9 mg/dL

## 2020-10-10 ENCOUNTER — Other Ambulatory Visit: Payer: Self-pay

## 2020-10-10 ENCOUNTER — Ambulatory Visit (INDEPENDENT_AMBULATORY_CARE_PROVIDER_SITE_OTHER): Payer: Medicare Other | Admitting: Dermatology

## 2020-10-10 DIAGNOSIS — L578 Other skin changes due to chronic exposure to nonionizing radiation: Secondary | ICD-10-CM | POA: Diagnosis not present

## 2020-10-10 DIAGNOSIS — D0471 Carcinoma in situ of skin of right lower limb, including hip: Secondary | ICD-10-CM | POA: Diagnosis not present

## 2020-10-10 DIAGNOSIS — D489 Neoplasm of uncertain behavior, unspecified: Secondary | ICD-10-CM

## 2020-10-10 DIAGNOSIS — Z1283 Encounter for screening for malignant neoplasm of skin: Secondary | ICD-10-CM

## 2020-10-10 DIAGNOSIS — L820 Inflamed seborrheic keratosis: Secondary | ICD-10-CM | POA: Diagnosis not present

## 2020-10-10 DIAGNOSIS — Z85828 Personal history of other malignant neoplasm of skin: Secondary | ICD-10-CM | POA: Diagnosis not present

## 2020-10-10 DIAGNOSIS — D0472 Carcinoma in situ of skin of left lower limb, including hip: Secondary | ICD-10-CM | POA: Diagnosis not present

## 2020-10-10 NOTE — Progress Notes (Signed)
Follow-Up Visit   Subjective  Wanda Baird is a 87 y.o. female who presents for the following: Follow-up (Patient here today for concerns for some red spots at bilateral feet, ankle and legs. Patient has history of scc skin cancer. ). The patient presents for Total-Body Skin Exam (TBSE) for skin cancer screening and mole check.  The following portions of the chart were reviewed this encounter and updated as appropriate:  Tobacco  Allergies  Meds  Problems  Med Hx  Surg Hx  Fam Hx     Objective  Well appearing patient in no apparent distress; mood and affect are within normal limits.  A full examination was performed including scalp, head, eyes, ears, nose, lips, neck, chest, axillae, abdomen, back, buttocks, bilateral upper extremities, bilateral lower extremities, hands, feet, fingers, toes, fingernails, and toenails. All findings within normal limits unless otherwise noted below.  right medial ankle 0.6 cm hyperkeratotic papules      Left 2nd Dorsum  Toe 1.1 cm hyperkeratotic papules      right foot/ right leg x 6 (6) Erythematous keratotic or waxy stuck-on papule or plaque.   Assessment & Plan  Neoplasm of uncertain behavior (2) right medial ankle Skin / nail biopsy Type of biopsy: tangential   Informed consent: discussed and consent obtained   Timeout: patient name, date of birth, surgical site, and procedure verified   Patient was prepped and draped in usual sterile fashion: Area prepped with isopropyl alcohol. Anesthesia: the lesion was anesthetized in a standard fashion   Anesthetic:  1% lidocaine w/ epinephrine 1-100,000 buffered w/ 8.4% NaHCO3 Instrument used: flexible razor blade   Hemostasis achieved with: aluminum chloride   Outcome: patient tolerated procedure well   Post-procedure details: wound care instructions given   Additional details:  Mupirocin and a bandage applied  Specimen 1 - Surgical pathology Differential Diagnosis: isk r/o  scc Check Margins: No Left 2nd Dorsum  Toe  Skin / nail biopsy Type of biopsy: tangential   Informed consent: discussed and consent obtained   Timeout: patient name, date of birth, surgical site, and procedure verified   Patient was prepped and draped in usual sterile fashion: Area prepped with isopropyl alcohol. Anesthesia: the lesion was anesthetized in a standard fashion   Anesthetic:  1% lidocaine w/ epinephrine 1-100,000 buffered w/ 8.4% NaHCO3 Instrument used: flexible razor blade   Hemostasis achieved with: aluminum chloride   Outcome: patient tolerated procedure well   Post-procedure details: wound care instructions given   Additional details:  Mupirocin and a bandage applied  Specimen 2 - Surgical pathology Differential Diagnosis: isk r/o scc   Check Margins: No Isk r/o scc   Inflamed seborrheic keratosis right foot/ right leg x 6 Prior to procedure, discussed risks of blister formation, small wound, skin dyspigmentation, or rare scar following cryotherapy. Recommend Vaseline ointment to treated areas while healing.  Destruction of lesion - right foot/ right leg x 6 Complexity: simple   Destruction method: cryotherapy   Informed consent: discussed and consent obtained   Timeout:  patient name, date of birth, surgical site, and procedure verified Lesion destroyed using liquid nitrogen: Yes   Region frozen until ice ball extended beyond lesion: Yes   Outcome: patient tolerated procedure well with no complications   Post-procedure details: wound care instructions given    Actinic Damage - chronic, secondary to cumulative UV radiation exposure/sun exposure over time - diffuse scaly erythematous macules with underlying dyspigmentation - Recommend daily broad spectrum sunscreen SPF 30+ to  sun-exposed areas, reapply every 2 hours as needed.  - Recommend staying in the shade or wearing long sleeves, sun glasses (UVA+UVB protection) and wide brim hats (4-inch brim around the  entire circumference of the hat). - Call for new or changing lesions.  Return in about 3 months (around 01/10/2021). IRuthell Rummage, CMA, am acting as scribe for Sarina Ser, MD. Documentation: I have reviewed the above documentation for accuracy and completeness, and I agree with the above.  Sarina Ser, MD

## 2020-10-10 NOTE — Patient Instructions (Addendum)
Cryotherapy Aftercare  Wash gently with soap and water everyday.   Apply Vaseline and Band-Aid daily until healed.      Biopsy Wound Care Instructions  Leave the original bandage on for 24 hours if possible.  If the bandage becomes soaked or soiled before that time, it is OK to remove it and examine the wound.  A small amount of post-operative bleeding is normal.  If excessive bleeding occurs, remove the bandage, place gauze over the site and apply continuous pressure (no peeking) over the area for 30 minutes. If this does not work, please call our clinic as soon as possible or page your doctor if it is after hours.   Once a day, cleanse the wound with soap and water. It is fine to shower. If a thick crust develops you may use a Q-tip dipped into dilute hydrogen peroxide (mix 1:1 with water) to dissolve it.  Hydrogen peroxide can slow the healing process, so use it only as needed.    After washing, apply petroleum jelly (Vaseline) or an antibiotic ointment if your doctor prescribed one for you, followed by a bandage.    For best healing, the wound should be covered with a layer of ointment at all times. If you are not able to keep the area covered with a bandage to hold the ointment in place, this may mean re-applying the ointment several times a day.  Continue this wound care until the wound has healed and is no longer open.   Itching and mild discomfort is normal during the healing process. However, if you develop pain or severe itching, please call our office.   If you have any discomfort, you can take Tylenol (acetaminophen) or ibuprofen as directed on the bottle. (Please do not take these if you have an allergy to them or cannot take them for another reason).  Some redness, tenderness and white or yellow material in the wound is normal healing.  If the area becomes very sore and red, or develops a thick yellow-green material (pus), it may be infected; please notify us.    If you have  stitches, return to clinic as directed to have the stitches removed. You will continue wound care for 2-3 days after the stitches are removed.   Wound healing continues for up to one year following surgery. It is not unusual to experience pain in the scar from time to time during the interval.  If the pain becomes severe or the scar thickens, you should notify the office.    A slight amount of redness in a scar is expected for the first six months.  After six months, the redness will fade and the scar will soften and fade.  The color difference becomes less noticeable with time.  If there are any problems, return for a post-op surgery check at your earliest convenience.  To improve the appearance of the scar, you can use silicone scar gel, cream, or sheets (such as Mederma or Serica) every night for up to one year. These are available over the counter (without a prescription).  Please call our office at [phone] for any questions or concerns.  If you have any questions or concerns for your doctor, please call our main line at [phone] and press option 4 to reach your doctor's medical assistant. If no one answers, please leave a voicemail as directed and we will return your call as soon as possible. Messages left after 4 pm will be answered the following business day.  You may also send [place] a message via [place]. We typically respond to MyChart messages within 1-2 business days.  For prescription refills, please ask your pharmacy to contact our office. Our fax number is [phone].  If you have an urgent issue when the clinic is closed that cannot wait until the next business day, you can page your doctor at the number below.    Please note that while we do our best to be available for urgent issues outside of office hours, we are not available 24/7.   If you have an urgent issue and are unable to reach [place], you may choose to seek medical care at your doctor's office, retail clinic,  urgent care center, or emergency room.  If you have a medical emergency, please immediately call 911 or go to the emergency department.  Pager Numbers  - Dr. Nehemiah Massed: [phone]  - Dr. Laurence Ferrari: [phone]  - Dr. Nicole Kindred: [phone]  In the event of inclement weather, please call our main line at [phone] for an update on the status of any delays or closures.  Dermatology Medication Tips: Please keep the boxes that topical medications come in in order to help keep track of the instructions about where and how to use these. Pharmacies typically print the medication instructions only on the boxes and not directly on the medication tubes.   If your medication is too expensive, please contact our office at [phone] option 4 or send [place] a message through [place].   We are unable to tell what your co-pay for medications will be in advance as this is different depending on your insurance coverage. However, we may be able to find a substitute medication at lower cost or fill out paperwork to get insurance to cover a needed medication.   If a prior authorization is required to get your medication covered by your insurance company, please allow [place] 1-2 business days to complete this process.  Drug prices often vary depending on where the prescription is filled and some pharmacies may offer cheaper prices.  The website www.goodrx.com contains coupons for medications through different pharmacies. The prices here do not account for what the cost may be with help from insurance (it may be cheaper with your insurance), but the website can give you the price if you did not use any insurance.  - You can print the associated coupon and take it with your prescription to the pharmacy.  - You may also stop by our office during regular business hours and pick up a GoodRx coupon card.  - If you need your prescription sent electronically to a different pharmacy, notify our office through [facility] or by phone at [phone] option 4.

## 2020-10-12 ENCOUNTER — Encounter: Payer: Self-pay | Admitting: Dermatology

## 2020-10-20 ENCOUNTER — Telehealth: Payer: Self-pay

## 2020-10-20 NOTE — Telephone Encounter (Signed)
Advised patient of results and scheduled for EDC/hd  

## 2020-10-20 NOTE — Telephone Encounter (Signed)
-----   Message from Ralene Bathe, MD sent at 10/18/2020  7:07 PM EDT ----- Diagnosis 1. Skin , right medial ankle SQUAMOUS CELL CARCINOMA IN SITU, CLOSE TO MARGIN 2. Skin , left 2nd dorsum toe SQUAMOUS CELL CARCINOMA IN SITU, BASE INVOLVED  1&2 - both cancer = SCC in situ Superficial Schedule for Treatment (EDC)

## 2020-10-24 ENCOUNTER — Other Ambulatory Visit: Payer: Self-pay

## 2020-10-24 ENCOUNTER — Ambulatory Visit (INDEPENDENT_AMBULATORY_CARE_PROVIDER_SITE_OTHER): Payer: Medicare Other | Admitting: Dermatology

## 2020-10-24 DIAGNOSIS — D0471 Carcinoma in situ of skin of right lower limb, including hip: Secondary | ICD-10-CM

## 2020-10-24 DIAGNOSIS — D0472 Carcinoma in situ of skin of left lower limb, including hip: Secondary | ICD-10-CM | POA: Diagnosis not present

## 2020-10-24 DIAGNOSIS — D099 Carcinoma in situ, unspecified: Secondary | ICD-10-CM

## 2020-10-24 NOTE — Progress Notes (Signed)
   Follow-Up Visit   Subjective  Wanda Baird is a 87 y.o. female who presents for the following: Wound Check (Check biopsy on 2nd toe and right medial ankle biopsy proven SCCis 10-10-20).  The following portions of the chart were reviewed this encounter and updated as appropriate:   Tobacco  Allergies  Meds  Problems  Med Hx  Surg Hx  Fam Hx     Review of Systems:  No other skin or systemic complaints except as noted in HPI or Assessment and Plan.  Objective  Well appearing patient in no apparent distress; mood and affect are within normal limits.  A focused examination was performed including right leg, left leg, feet. Relevant physical exam findings are noted in the Assessment and Plan.  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 Reassured.  May heal slowly due to location. Return for [facility] of biopsy proven SCCis   Cleansed skin with Puracyn, applied Mupirocin and bandaid   Return for [facility] of biopsy proven SCCis 11-21-20.  IMarye Round, CMA, am acting as scribe for Sarina Ser, MD .  Documentation: I have reviewed the above documentation for accuracy and completeness, and I agree with the above.  Sarina Ser, MD

## 2020-10-24 NOTE — Patient Instructions (Signed)

## 2020-10-25 ENCOUNTER — Encounter: Payer: Self-pay | Admitting: Dermatology

## 2020-11-10 ENCOUNTER — Ambulatory Visit: Payer: Medicare Other | Admitting: Dermatology

## 2020-11-21 ENCOUNTER — Other Ambulatory Visit: Payer: Self-pay

## 2020-11-21 ENCOUNTER — Ambulatory Visit (INDEPENDENT_AMBULATORY_CARE_PROVIDER_SITE_OTHER): Payer: Medicare Other | Admitting: Dermatology

## 2020-11-21 DIAGNOSIS — D0472 Carcinoma in situ of skin of left lower limb, including hip: Secondary | ICD-10-CM

## 2020-11-21 DIAGNOSIS — D0471 Carcinoma in situ of skin of right lower limb, including hip: Secondary | ICD-10-CM | POA: Diagnosis not present

## 2020-11-21 DIAGNOSIS — L578 Other skin changes due to chronic exposure to nonionizing radiation: Secondary | ICD-10-CM | POA: Diagnosis not present

## 2020-11-21 DIAGNOSIS — D0462 Carcinoma in situ of skin of left upper limb, including shoulder: Secondary | ICD-10-CM | POA: Diagnosis not present

## 2020-11-21 DIAGNOSIS — D099 Carcinoma in situ, unspecified: Secondary | ICD-10-CM

## 2020-11-21 DIAGNOSIS — D485 Neoplasm of uncertain behavior of skin: Secondary | ICD-10-CM

## 2020-11-21 NOTE — Progress Notes (Signed)
Follow-Up Visit   Subjective  Wanda Baird is a 87 y.o. 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 Patient has a new lesion on her L hand she would like treated today.  The following portions of the chart were reviewed this encounter and updated as appropriate:   Tobacco  Allergies  Meds  Problems  Med Hx  Surg Hx  Fam Hx     Review of Systems:  No other skin or systemic complaints except as noted in HPI or Assessment and Plan.  Objective  Well appearing patient in no apparent distress; mood and affect are within normal limits.  A focused examination was performed including the hands, feet, and lower legs. Relevant physical exam findings are noted in the Assessment and Plan.  R med ankle Healing bx site.  L 2nd toe Healing bx site.  L hand dorsum 1.1 cm hyperkeratotic papule.   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  Destruction of lesion Complexity: extensive   Destruction method: electrodesiccation and curettage   Informed consent: discussed and consent obtained   Timeout:  patient name, date of birth, surgical site, and procedure verified Procedure prep:  Patient was prepped and draped in usual sterile fashion Prep type:  Isopropyl alcohol Anesthesia: the lesion was anesthetized in a standard fashion   Anesthetic:  1% lidocaine w/ epinephrine 1-100,000 buffered w/ 8.4% NaHCO3 Curettage performed in three different directions: Yes   Electrodesiccation performed over the curetted area: Yes   Lesion length (cm):  0.8 Lesion width (cm):  0.8 Margin per side (cm):  0.2 Final wound size (cm):  1.2 Hemostasis achieved with:  pressure, aluminum chloride and electrodesiccation Outcome: patient tolerated procedure well with no complications   Post-procedure details: sterile dressing applied and wound care instructions given   Dressing  type: bandage and petrolatum    L 2nd toe  Destruction of lesion Complexity: extensive   Destruction method: electrodesiccation and curettage   Informed consent: discussed and consent obtained   Timeout:  patient name, date of birth, surgical site, and procedure verified Procedure prep:  Patient was prepped and draped in usual sterile fashion Prep type:  Isopropyl alcohol Anesthesia: the lesion was anesthetized in a standard fashion   Anesthetic:  1% lidocaine w/ epinephrine 1-100,000 buffered w/ 8.4% NaHCO3 Curettage performed in three different directions: Yes   Electrodesiccation performed over the curetted area: Yes   Lesion length (cm):  1.1 Lesion width (cm):  1.1 Margin per side (cm):  0.2 Final wound size (cm):  1.5 Hemostasis achieved with:  pressure, aluminum chloride and electrodesiccation Outcome: patient tolerated procedure well with no complications   Post-procedure details: sterile dressing applied and wound care instructions given   Dressing type: bandage and petrolatum    Neoplasm of uncertain behavior of skin L hand dorsum  Epidermal / dermal shaving  Lesion diameter (cm):  1.1 Informed consent: discussed and consent obtained   Timeout: patient name, date of birth, surgical site, and procedure verified   Procedure prep:  Patient was prepped and draped in usual sterile fashion Prep type:  Isopropyl alcohol Anesthesia: the lesion was anesthetized in a standard fashion   Anesthetic:  1% lidocaine w/ epinephrine 1-100,000 buffered w/ 8.4% NaHCO3 Instrument used: flexible razor blade   Hemostasis achieved with: pressure, aluminum chloride and electrodesiccation   Outcome: patient tolerated procedure  well   Post-procedure details: sterile dressing applied and wound care instructions given   Dressing type: bandage and petrolatum    Destruction of lesion Complexity: extensive   Destruction method: electrodesiccation and curettage   Informed consent: discussed  and consent obtained   Timeout:  patient name, date of birth, surgical site, and procedure verified Procedure prep:  Patient was prepped and draped in usual sterile fashion Prep type:  Isopropyl alcohol Anesthesia: the lesion was anesthetized in a standard fashion   Anesthetic:  1% lidocaine w/ epinephrine 1-100,000 buffered w/ 8.4% NaHCO3 Curettage performed in three different directions: Yes   Electrodesiccation performed over the curetted area: Yes   Lesion length (cm):  1.1 Lesion width (cm):  1.1 Margin per side (cm):  0.2 Final wound size (cm):  1.5 Hemostasis achieved with:  pressure, aluminum chloride and electrodesiccation Outcome: patient tolerated procedure well with no complications   Post-procedure details: sterile dressing applied and wound care instructions given   Dressing type: bandage and petrolatum    Specimen 1 - Surgical pathology Differential Diagnosis: D48.5 r/o SCC vs other  ED&C today  Check Margins: No  Actinic Damage - chronic, secondary to cumulative UV radiation exposure/sun exposure over time - diffuse scaly erythematous macules with underlying dyspigmentation - Recommend daily broad spectrum sunscreen SPF 30+ to sun-exposed areas, reapply every 2 hours as needed.  - Recommend staying in the shade or wearing long sleeves, sun glasses (UVA+UVB protection) and wide brim hats (4-inch brim around the entire circumference of the hat). - Call for new or changing lesions.  Return for appointment as scheduled.  Luther Redo, CMA, am acting as scribe for Sarina Ser, MD . Documentation: I have reviewed the above documentation for accuracy and completeness, and I agree with the above.  Sarina Ser, MD

## 2020-11-21 NOTE — Patient Instructions (Signed)
Electrodesiccation and Curettage (“Scrape and Burn”) Wound Care Instructions ° °Leave the original bandage on for 24 hours if possible.  If the bandage becomes soaked or soiled before that time, it is OK to remove it and examine the wound.  A small amount of post-operative bleeding is normal.  If excessive bleeding occurs, remove the bandage, place gauze over the site and apply continuous pressure (no peeking) over the area for 30 minutes. If this does not work, please call our clinic as soon as possible or page your doctor if it is after hours.  ° °Once a day, cleanse the wound with soap and water. It is fine to shower. If a thick crust develops you may use a Q-tip dipped into dilute hydrogen peroxide (mix 1:1 with water) to dissolve it.  Hydrogen peroxide can slow the healing process, so use it only as needed.   ° °After washing, apply petroleum jelly (Vaseline) or an antibiotic ointment if your doctor prescribed one for you, followed by a bandage.   ° °For best healing, the wound should be covered with a layer of ointment at all times. If you are not able to keep the area covered with a bandage to hold the ointment in place, this may mean re-applying the ointment several times a day.  Continue this wound care until the wound has healed and is no longer open. It may take several weeks for the wound to heal and close. ° °Itching and mild discomfort is normal during the healing process. ° °If you have any discomfort, you can take Tylenol (acetaminophen) or ibuprofen as directed on the bottle. (Please do not take these if you have an allergy to them or cannot take them for another reason). ° °Some redness, tenderness and white or yellow material in the wound is normal healing.  If the area becomes very sore and red, or develops a thick yellow-green material (pus), it may be infected; please notify us.   ° °Wound healing continues for up to one year following surgery. It is not unusual to experience pain in the scar  from time to time during the interval.  If the pain becomes severe or the scar thickens, you should notify the office.   ° °A slight amount of redness in a scar is expected for the first six months.  After six months, the redness will fade and the scar will soften and fade.  The color difference becomes less noticeable with time.  If there are any problems, return for a post-op surgery check at your earliest convenience. ° °To improve the appearance of the scar, you can use silicone scar gel, cream, or sheets (such as Mederma or Serica) every night for up to one year. These are available over the counter (without a prescription). ° °Please call our office at [phone] for any questions or concerns. °

## 2020-11-24 ENCOUNTER — Encounter: Payer: Self-pay | Admitting: Dermatology

## 2020-11-25 ENCOUNTER — Telehealth: Payer: Self-pay

## 2020-11-25 NOTE — Telephone Encounter (Signed)
-----   Message from Ralene Bathe, MD sent at 11/24/2020  2:19 PM EDT ----- Diagnosis Skin , left hand dorsum SQUAMOUS CELL CARCINOMA IN SITU, ASSOCIATED WITH VERRUCA, BASE INVOLVED  Cancer - SCC in situ Superficial Already treated Recheck next visit

## 2020-11-25 NOTE — Telephone Encounter (Signed)
Left message on voicemail to return my call.  

## 2020-11-28 ENCOUNTER — Telehealth: Payer: Self-pay

## 2020-11-28 NOTE — Telephone Encounter (Signed)
-----   Message from Ralene Bathe, MD sent at 11/24/2020  2:19 PM EDT ----- Diagnosis Skin , left hand dorsum SQUAMOUS CELL CARCINOMA IN SITU, ASSOCIATED WITH VERRUCA, BASE INVOLVED  Cancer - SCC in situ Superficial Already treated Recheck next visit

## 2020-11-28 NOTE — Telephone Encounter (Signed)
Patient advised of BX results.  °

## 2021-01-11 ENCOUNTER — Ambulatory Visit (INDEPENDENT_AMBULATORY_CARE_PROVIDER_SITE_OTHER): Payer: Medicare Other | Admitting: Dermatology

## 2021-01-11 ENCOUNTER — Other Ambulatory Visit: Payer: Self-pay

## 2021-01-11 DIAGNOSIS — L578 Other skin changes due to chronic exposure to nonionizing radiation: Secondary | ICD-10-CM

## 2021-01-11 DIAGNOSIS — L814 Other melanin hyperpigmentation: Secondary | ICD-10-CM

## 2021-01-11 DIAGNOSIS — L820 Inflamed seborrheic keratosis: Secondary | ICD-10-CM | POA: Diagnosis not present

## 2021-01-11 DIAGNOSIS — Z86018 Personal history of other benign neoplasm: Secondary | ICD-10-CM | POA: Diagnosis not present

## 2021-01-11 DIAGNOSIS — Z85828 Personal history of other malignant neoplasm of skin: Secondary | ICD-10-CM

## 2021-01-11 DIAGNOSIS — L570 Actinic keratosis: Secondary | ICD-10-CM | POA: Diagnosis not present

## 2021-01-11 DIAGNOSIS — Z1283 Encounter for screening for malignant neoplasm of skin: Secondary | ICD-10-CM | POA: Diagnosis not present

## 2021-01-11 DIAGNOSIS — D229 Melanocytic nevi, unspecified: Secondary | ICD-10-CM

## 2021-01-11 DIAGNOSIS — Z872 Personal history of diseases of the skin and subcutaneous tissue: Secondary | ICD-10-CM

## 2021-01-11 DIAGNOSIS — L821 Other seborrheic keratosis: Secondary | ICD-10-CM

## 2021-01-11 DIAGNOSIS — D1801 Hemangioma of skin and subcutaneous tissue: Secondary | ICD-10-CM

## 2021-01-11 MED ORDER — MUPIROCIN 2 % EX OINT: 1.0000 "application " | TOPICAL_OINTMENT | Freq: Every day | CUTANEOUS | 6 refills | Status: DC

## 2021-01-11 NOTE — Patient Instructions (Signed)

## 2021-01-11 NOTE — Progress Notes (Signed)
Follow-Up Visit   Subjective  Wanda Baird is a 87 y.o. female who presents for the following: Annual Exam (history of dysplastic nevus and SCC - TBSE today). Has rough areas on the arms and legs she would like evaluated.  The patient presents for Total-Body Skin Exam (TBSE) for skin cancer screening and mole check.  The following portions of the chart were reviewed this encounter and updated as appropriate:   Tobacco  Allergies  Meds  Problems  Med Hx  Surg Hx  Fam Hx     Review of Systems:  No other skin or systemic complaints except as noted in HPI or Assessment and Plan.  Objective  Well appearing patient in no apparent distress; mood and affect are within normal limits.  A full examination was performed including scalp, head, eyes, ears, nose, lips, neck, chest, axillae, abdomen, back, buttocks, bilateral upper extremities, bilateral lower extremities, hands, feet, fingers, toes, fingernails, and toenails. All findings within normal limits unless otherwise noted below.  Arms and Legs (9) Erythematous thin papules/macules with gritty scale.   Arms and Legs (24) Erythematous keratotic or waxy stuck-on papule or plaque.    Assessment & Plan   History of Dysplastic Nevi - No evidence of recurrence today - Recommend regular full body skin exams - Recommend daily broad spectrum sunscreen SPF 30+ to sun-exposed areas, reapply every 2 hours as needed.  - Call if any new or changing lesions are noted between office visits  History of Squamous Cell Carcinoma of the Skin - No evidence of recurrence today - No lymphadenopathy - Recommend regular full body skin exams - Recommend daily broad spectrum sunscreen SPF 30+ to sun-exposed areas, reapply every 2 hours as needed.  - Call if any new or changing lesions are noted between office visits  Lentigines - Scattered tan macules - Due to sun exposure - Benign-appearing, observe - Recommend daily broad spectrum  sunscreen SPF 30+ to sun-exposed areas, reapply every 2 hours as needed. - Call for any changes  Seborrheic Keratoses - Stuck-on, waxy, tan-brown papules and/or plaques  - Benign-appearing - Discussed benign etiology and prognosis. - Observe - Call for any changes  Melanocytic Nevi - Tan-brown and/or pink-flesh-colored symmetric macules and papules - Benign appearing on exam today - Observation - Call clinic for new or changing moles - Recommend daily use of broad spectrum spf 30+ sunscreen to sun-exposed areas.   Hemangiomas - Red papules - Discussed benign nature - Observe - Call for any changes  Actinic Damage - Chronic condition, secondary to cumulative UV/sun exposure - diffuse scaly erythematous macules with underlying dyspigmentation - Recommend daily broad spectrum sunscreen SPF 30+ to sun-exposed areas, reapply every 2 hours as needed.  - Staying in the shade or wearing long sleeves, sun glasses (UVA+UVB protection) and wide brim hats (4-inch brim around the entire circumference of the hat) are also recommended for sun protection.  - Call for new or changing lesions.  Skin cancer screening performed today.  AK (actinic keratosis) (9) Arms and Legs  Destruction of lesion - Arms and Legs Complexity: simple   Destruction method: cryotherapy   Informed consent: discussed and consent obtained   Timeout:  patient name, date of birth, surgical site, and procedure verified Lesion destroyed using liquid nitrogen: Yes   Region frozen until ice ball extended beyond lesion: Yes   Outcome: patient tolerated procedure well with no complications   Post-procedure details: wound care instructions given    Inflamed seborrheic keratosis Arms  and Legs x24  Destruction of lesion - Arms and Legs Complexity: simple   Destruction method: cryotherapy   Informed consent: discussed and consent obtained   Timeout:  patient name, date of birth, surgical site, and procedure  verified Lesion destroyed using liquid nitrogen: Yes   Region frozen until ice ball extended beyond lesion: Yes   Outcome: patient tolerated procedure well with no complications   Post-procedure details: wound care instructions given    History of cellulitis  Related Medications mupirocin ointment (BACTROBAN) 2 % Apply 1 application topically daily. Qd to wounds  Skin cancer screening  Return in about 3 months (around 04/13/2021) for AK follow up, ISK follow up.  I, Ashok Cordia, CMA, am acting as scribe for Sarina Ser, MD . Documentation: I have reviewed the above documentation for accuracy and completeness, and I agree with the above.  Sarina Ser, MD

## 2021-01-24 ENCOUNTER — Encounter: Payer: Self-pay | Admitting: Dermatology

## 2021-04-19 ENCOUNTER — Ambulatory Visit: Payer: Medicare Other | Admitting: Dermatology

## 2021-05-08 ENCOUNTER — Ambulatory Visit: Payer: Medicare Other | Admitting: Dermatology

## 2021-07-10 IMAGING — MG MM DIGITAL SCREENING BILAT W/ TOMO W/ CAD
8 series · 8 of 24 positions shown · non-contrast
Comparison: Previous exam(s).

CLINICAL DATA: Screening.

EXAM:
DIGITAL SCREENING BILATERAL MAMMOGRAM WITH TOMO AND CAD

[R MLO synth-2D]
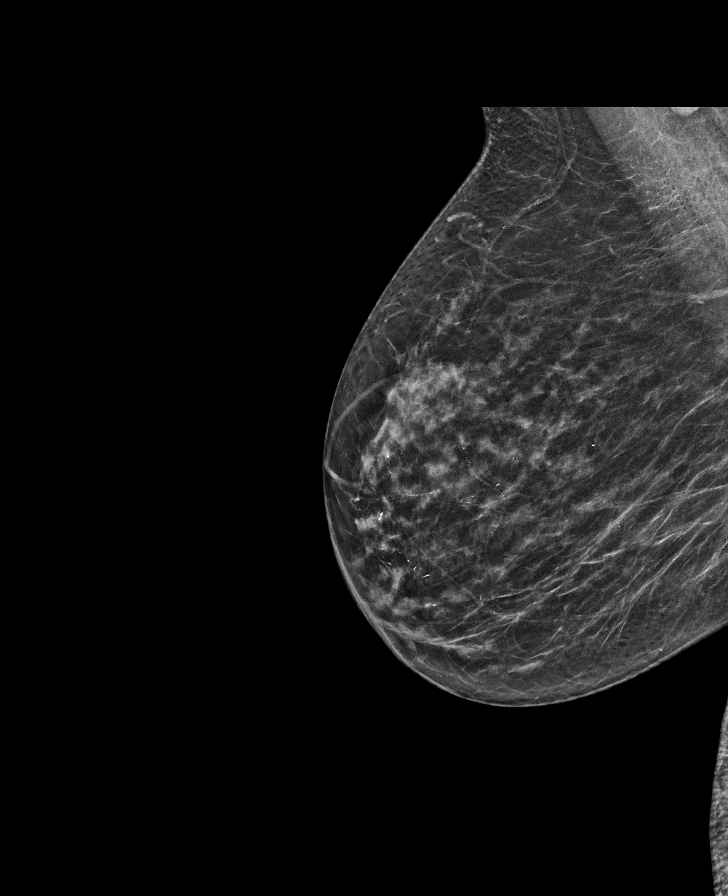

[L CC synth-2D]
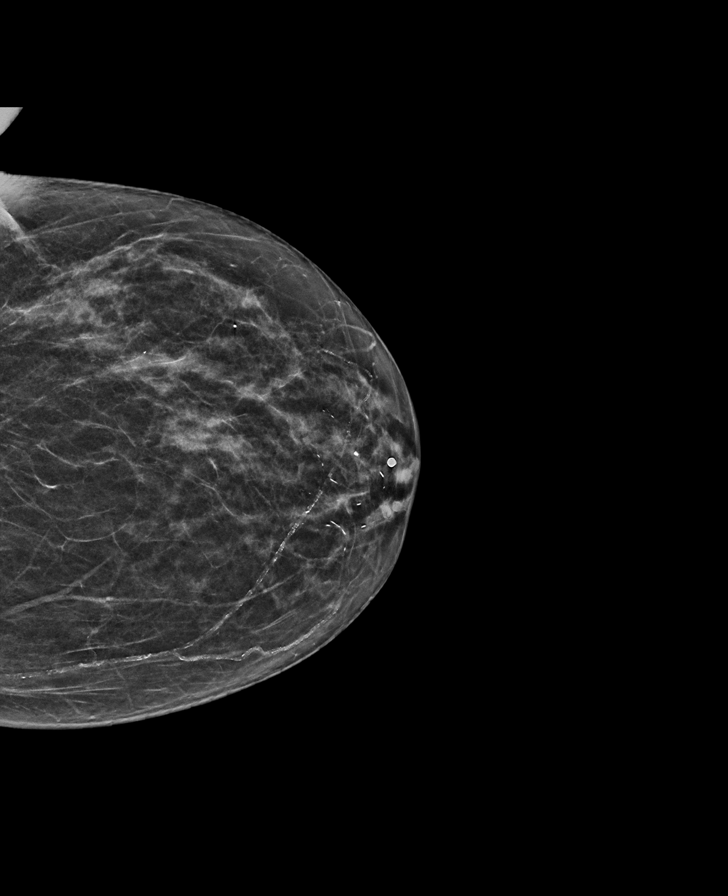

[R CC synth-2D]
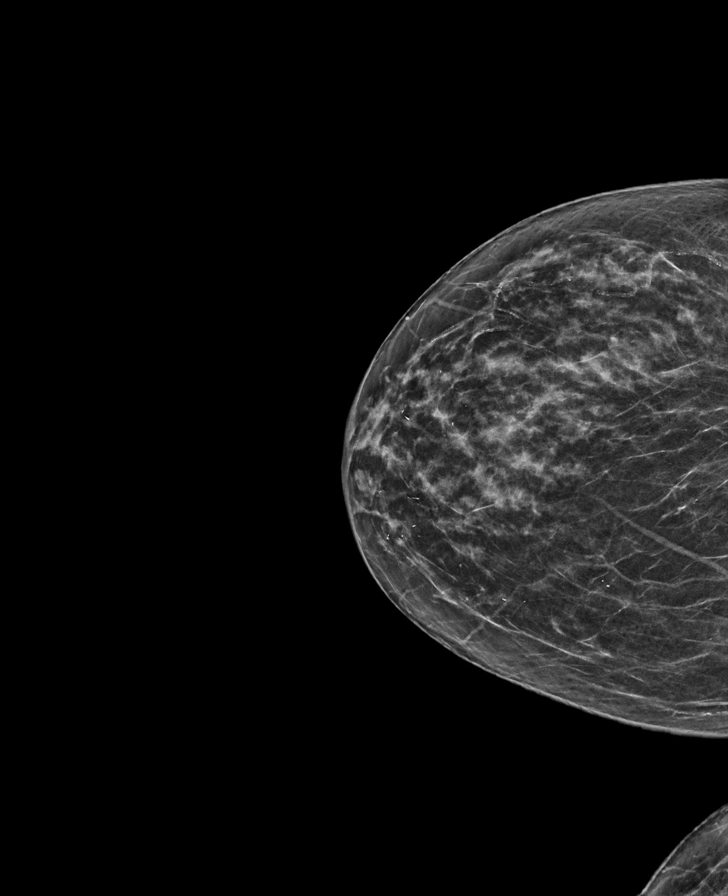

[L MLO synth-2D]
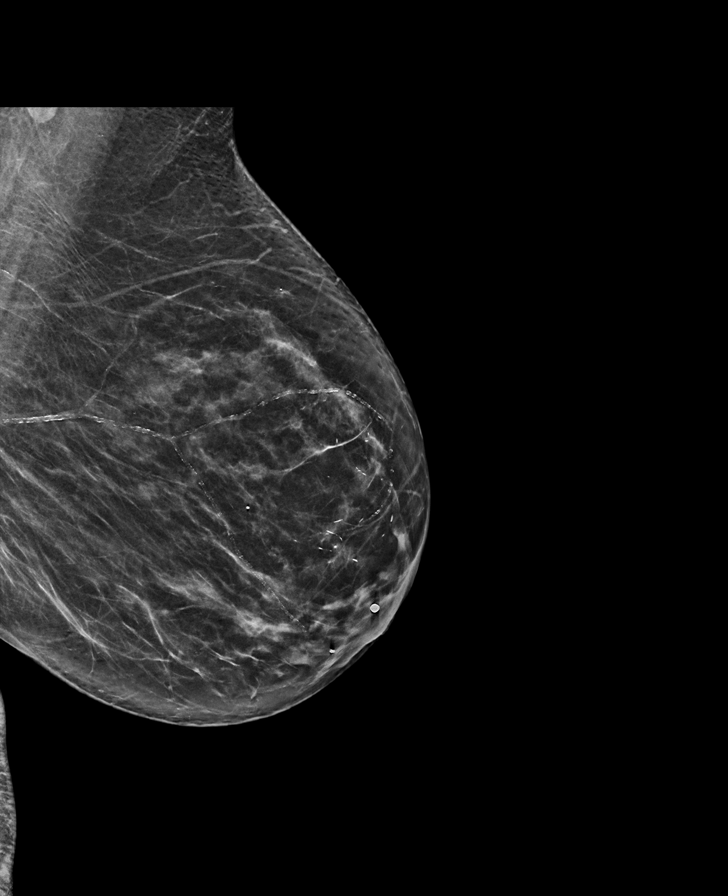

[R MLO tomo · tomo slice 27/52.0]
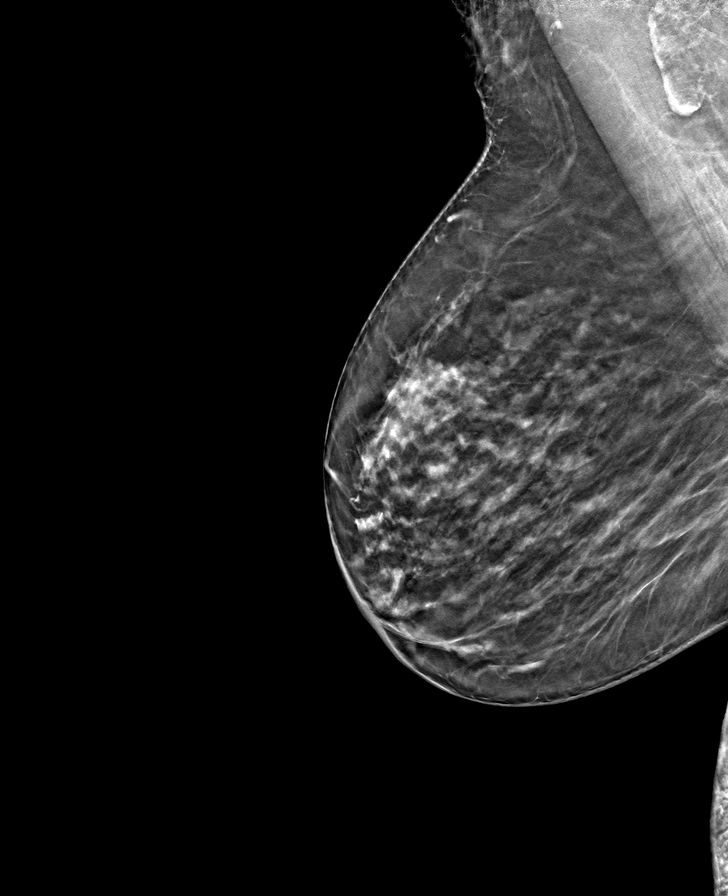

[L MLO tomo · tomo slice 33/64.0]
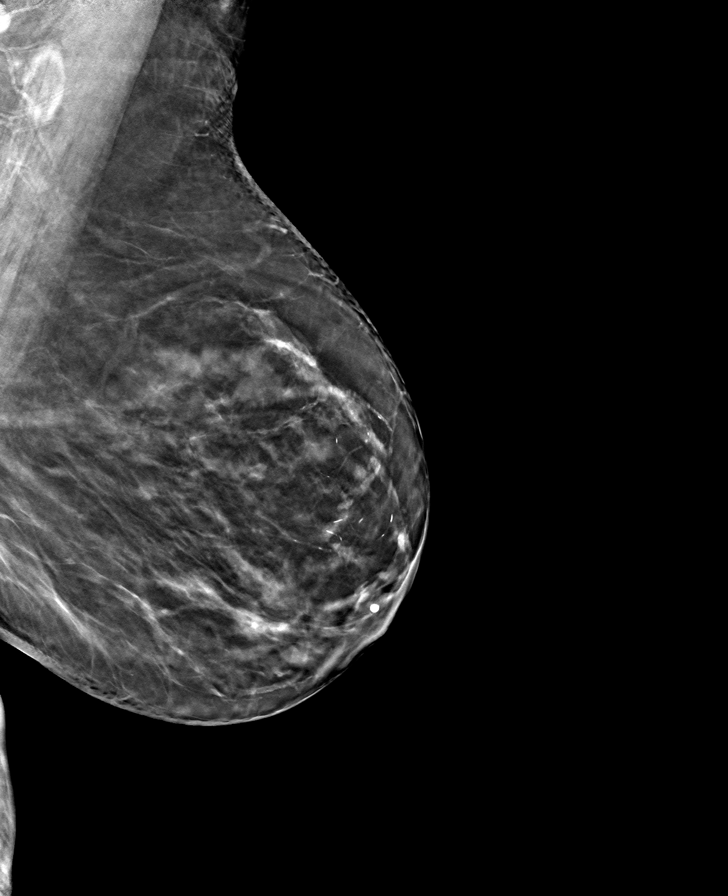

[L CC tomo · tomo slice 26/51.0]
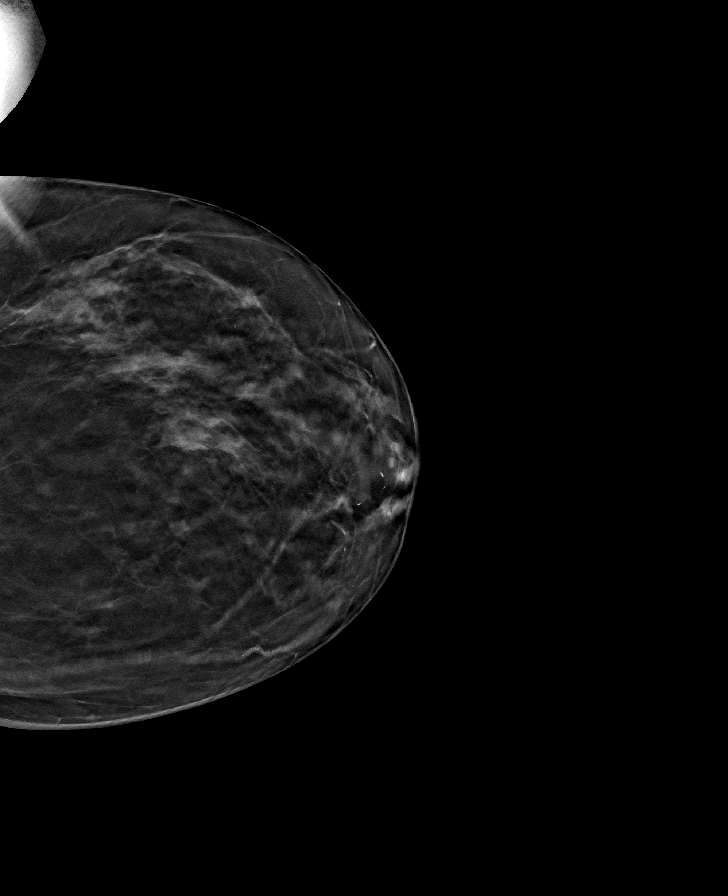

[R CC tomo · tomo slice 28/55.0]
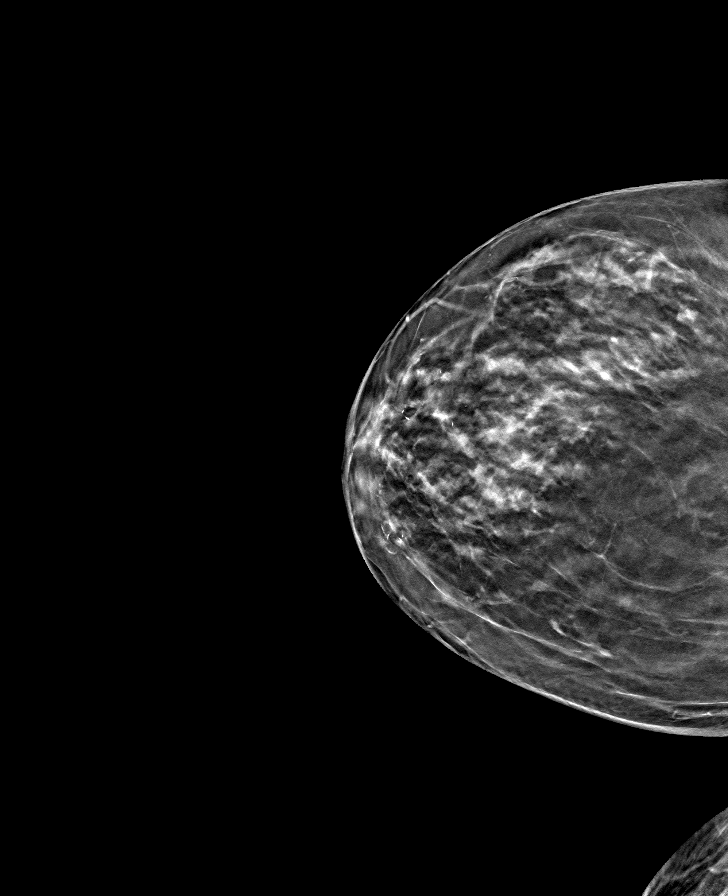

[8 of 24 positions shown; findings below may reference images not displayed]

ACR Breast Density Category b: There are scattered areas of
fibroglandular density.
FINDINGS: There are no findings suspicious for malignancy. Images were
processed with CAD.
IMPRESSION: No mammographic evidence of malignancy. A result letter of this
screening mammogram will be mailed directly to the patient.

RECOMMENDATION:
Screening mammogram in one year. (Code:[id number])

BI-RADS CATEGORY  1: Negative.

## 2022-01-10 ENCOUNTER — Ambulatory Visit (INDEPENDENT_AMBULATORY_CARE_PROVIDER_SITE_OTHER): Payer: Medicare Other | Admitting: Dermatology

## 2022-01-10 DIAGNOSIS — L578 Other skin changes due to chronic exposure to nonionizing radiation: Secondary | ICD-10-CM | POA: Diagnosis not present

## 2022-01-10 DIAGNOSIS — L570 Actinic keratosis: Secondary | ICD-10-CM | POA: Diagnosis not present

## 2022-01-10 DIAGNOSIS — Z85828 Personal history of other malignant neoplasm of skin: Secondary | ICD-10-CM

## 2022-01-10 DIAGNOSIS — Z1283 Encounter for screening for malignant neoplasm of skin: Secondary | ICD-10-CM

## 2022-01-10 DIAGNOSIS — Z86018 Personal history of other benign neoplasm: Secondary | ICD-10-CM

## 2022-01-10 DIAGNOSIS — L821 Other seborrheic keratosis: Secondary | ICD-10-CM

## 2022-01-10 DIAGNOSIS — L814 Other melanin hyperpigmentation: Secondary | ICD-10-CM | POA: Diagnosis not present

## 2022-01-10 DIAGNOSIS — L820 Inflamed seborrheic keratosis: Secondary | ICD-10-CM | POA: Diagnosis not present

## 2022-01-10 DIAGNOSIS — Z8589 Personal history of malignant neoplasm of other organs and systems: Secondary | ICD-10-CM

## 2022-01-10 DIAGNOSIS — D229 Melanocytic nevi, unspecified: Secondary | ICD-10-CM

## 2022-01-10 DIAGNOSIS — D692 Other nonthrombocytopenic purpura: Secondary | ICD-10-CM

## 2022-01-10 NOTE — Patient Instructions (Addendum)
Cryotherapy Aftercare  Wash gently with soap and water everyday.   Apply Vaseline and Band-Aid daily until healed.     Due to recent changes in healthcare laws, you may see results of your pathology and/or laboratory studies on MyChart before the doctors have had a chance to review them. We understand that in some cases there may be results that are confusing or concerning to you. Please understand that not all results are received at the same time and often the doctors may need to interpret multiple results in order to provide you with the best plan of care or course of treatment. Therefore, we ask that you please give us 2 business days to thoroughly review all your results before contacting the office for clarification. Should we see a critical lab result, you will be contacted sooner.   If You Need Anything After Your Visit  If you have any questions or concerns for your doctor, please call our main line at [phone] and press option 4 to reach your doctor's medical assistant. If no one answers, please leave a voicemail as directed and we will return your call as soon as possible. Messages left after 4 pm will be answered the following business day.   You may also send us a message via MyChart. We typically respond to MyChart messages within 1-2 business days.  For prescription refills, please ask your pharmacy to contact our office. Our fax number is [phone].  If you have an urgent issue when the clinic is closed that cannot wait until the next business day, you can page your doctor at the number below.    Please note that while we do our best to be available for urgent issues outside of office hours, we are not available 24/7.   If you have an urgent issue and are unable to reach us, you may choose to seek medical care at your doctor's office, retail clinic, urgent care center, or emergency room.  If you have a medical emergency, please immediately call 911 or go to the  emergency department.  Pager Numbers  - Dr. Kowalski: [phone]  - Dr. Moye: [phone]  - Dr. Stewart: [phone]  In the event of inclement weather, please call our main line at [phone] for an update on the status of any delays or closures.  Dermatology Medication Tips: Please keep the boxes that topical medications come in in order to help keep track of the instructions about where and how to use these. Pharmacies typically print the medication instructions only on the boxes and not directly on the medication tubes.   If your medication is too expensive, please contact our office at [phone] option 4 or send us a message through MyChart.   We are unable to tell what your co-pay for medications will be in advance as this is different depending on your insurance coverage. However, we may be able to find a substitute medication at lower cost or fill out paperwork to get insurance to cover a needed medication.   If a prior authorization is required to get your medication covered by your insurance company, please allow us 1-2 business days to complete this process.  Drug prices often vary depending on where the prescription is filled and some pharmacies may offer cheaper prices.  The website www.goodrx.com contains coupons for medications through different pharmacies. The prices here do not account for what the cost may be with help from insurance (it may be cheaper with your insurance), but the website can   give you the price if you did not use any insurance.  - You can print the associated coupon and take it with your prescription to the pharmacy.  - You may also stop by our office during regular business hours and pick up a GoodRx coupon card.  - If you need your prescription sent electronically to a different pharmacy, notify our office through [place] MyChart or by phone at [phone] option 4.     Si Usted Necesita Algo Despus de Su Visita  Tambin puede  enviarnos un mensaje a travs de MyChart. Por lo general respondemos a los mensajes de MyChart en el transcurso de 1 a 2 das hbiles.  Para renovar recetas, por favor pida a su farmacia que se ponga en contacto con nuestra oficina. Nuestro nmero de fax es el [phone].  Si tiene un asunto urgente cuando la clnica est cerrada y que no puede esperar hasta el siguiente da hbil, puede llamar/localizar a su doctor(a) al nmero que aparece a continuacin.   Por favor, tenga en cuenta que aunque hacemos todo lo posible para estar disponibles para asuntos urgentes fuera del horario de oficina, no estamos disponibles las 24 horas del da, los 7 das de la semana.   Si tiene un problema urgente y no puede comunicarse con nosotros, puede optar por buscar atencin mdica  en el consultorio de su doctor(a), en una clnica privada, en un centro de atencin urgente o en una sala de emergencias.  Si tiene una emergencia mdica, por favor llame inmediatamente al 911 o vaya a la sala de emergencias.  Nmeros de bper  - Dr. Kowalski: [phone]  - Dra. Moye: [phone]  - Dra. Stewart: [phone]  En caso de inclemencias del tiempo, por favor llame a nuestra lnea principal al [phone] para una actualizacin sobre el estado de cualquier retraso o cierre.  Consejos para la medicacin en dermatologa: Por favor, guarde las cajas en las que vienen los medicamentos de uso tpico para ayudarle a seguir las instrucciones sobre dnde y cmo usarlos. Las farmacias generalmente imprimen las instrucciones del medicamento slo en las cajas y no directamente en los tubos del medicamento.   Si su medicamento es muy caro, por favor, pngase en contacto con nuestra oficina llamando al [phone] y presione la opcin 4 o envenos un mensaje a travs de MyChart.   No podemos decirle cul ser su copago por los medicamentos por adelantado ya que esto es diferente dependiendo de la cobertura de su seguro.  Sin embargo, es posible que podamos encontrar un medicamento sustituto a menor costo o llenar un formulario para que el seguro cubra el medicamento que se considera necesario.   Si se requiere una autorizacin previa para que su compaa de seguros cubra su medicamento, por favor permtanos de 1 a 2 das hbiles para completar este proceso.  Los precios de los medicamentos varan con frecuencia dependiendo del lugar de dnde se surte la receta y alguna farmacias pueden ofrecer precios ms baratos.  El sitio web www.goodrx.com tiene cupones para medicamentos de diferentes farmacias. Los precios aqu no tienen en cuenta lo que podra costar con la ayuda del seguro (puede ser ms barato con su seguro), pero el sitio web puede darle el precio si no utiliz ningn seguro.  - Puede imprimir el cupn correspondiente y llevarlo con su receta a la farmacia.  - Tambin puede pasar por nuestra oficina durante el horario de atencin regular y recoger una tarjeta de cupones de GoodRx.  -   Si necesita que su receta se enve electrnicamente a una farmacia diferente, informe a nuestra oficina a travs de MyChart de [place] o por telfono llamando al [phone] y presione la opcin 4.  

## 2022-01-10 NOTE — Progress Notes (Signed)
Follow-Up Visit   Subjective  Wanda Baird is a 88 y.o. female who presents for the following: Annual Exam (Hx SCC, dysplastic nevi, AK) and Total body skin exam (Hx of SCC, Dysplastic Nevis). The patient presents for Total-Body Skin Exam (TBSE) for skin cancer screening and mole check.  The patient has spots, moles and lesions to be evaluated, some may be new or changing and the patient has concerns that these could be cancer.   The following portions of the chart were reviewed this encounter and updated as appropriate:   Tobacco  Allergies  Meds  Problems  Med Hx  Surg Hx  Fam Hx     Review of Systems:  No other skin or systemic complaints except as noted in HPI or Assessment and Plan.  Objective  Well appearing patient in no apparent distress; mood and affect are within normal limits.  A full examination was performed including scalp, head, eyes, ears, nose, lips, neck, chest, axillae, abdomen, back, buttocks, bilateral upper extremities, bilateral lower extremities, hands, feet, fingers, toes, fingernails, and toenails. All findings within normal limits unless otherwise noted below.  Right Ear x 1 Pink scaly macules  R dorsum foot x 1 Stuck on waxy paps with erythema   Assessment & Plan   Lentigines - Scattered tan macules - Due to sun exposure - Benign-appearing, observe - Recommend daily broad spectrum sunscreen SPF 30+ to sun-exposed areas, reapply every 2 hours as needed. - Call for any changes - back, arms  Seborrheic Keratoses - Stuck-on, waxy, tan-brown papules and/or plaques  - Benign-appearing - Discussed benign etiology and prognosis. - Observe - Call for any changes - arms, trunk, legs  Melanocytic Nevi - Tan-brown and/or pink-flesh-colored symmetric macules and papules - Benign appearing on exam today - Observation - Call clinic for new or changing moles - Recommend daily use of broad spectrum spf 30+ sunscreen to sun-exposed areas.    Hemangiomas - Red papules - Discussed benign nature - Observe - Call for any changes - trunk  Actinic Damage - Chronic condition, secondary to cumulative UV/sun exposure - diffuse scaly erythematous macules with underlying dyspigmentation - Recommend daily broad spectrum sunscreen SPF 30+ to sun-exposed areas, reapply every 2 hours as needed.  - Staying in the shade or wearing long sleeves, sun glasses (UVA+UVB protection) and wide brim hats (4-inch brim around the entire circumference of the hat) are also recommended for sun protection.  - Call for new or changing lesions.  Skin cancer screening performed today.   History of Squamous Cell Carcinoma of the Skin - No evidence of recurrence today - No lymphadenopathy - Recommend regular full body skin exams - Recommend daily broad spectrum sunscreen SPF 30+ to sun-exposed areas, reapply every 2 hours as needed.  - Call if any new or changing lesions are noted between office visits - multiple  History of Dysplastic Nevi - No evidence of recurrence today - Recommend regular full body skin exams - Recommend daily broad spectrum sunscreen SPF 30+ to sun-exposed areas, reapply every 2 hours as needed.  - Call if any new or changing lesions are noted between office visits  - R distal lat thigh  AK (actinic keratosis) Right Ear x 1  Destruction of lesion - Right Ear x 1 Complexity: simple   Destruction method: cryotherapy   Informed consent: discussed and consent obtained   Timeout:  patient name, date of birth, surgical site, and procedure verified Lesion destroyed using liquid nitrogen: Yes  Region frozen until ice ball extended beyond lesion: Yes   Outcome: patient tolerated procedure well with no complications   Post-procedure details: wound care instructions given    Inflamed seborrheic keratosis R dorsum foot x 1  Symptomatic, irritating, patient would like treated.   Destruction of lesion - R dorsum foot x  1 Complexity: simple   Destruction method: cryotherapy   Informed consent: discussed and consent obtained   Timeout:  patient name, date of birth, surgical site, and procedure verified Lesion destroyed using liquid nitrogen: Yes   Region frozen until ice ball extended beyond lesion: Yes   Outcome: patient tolerated procedure well with no complications   Post-procedure details: wound care instructions given     Purpura - Chronic; persistent and recurrent.  Treatable, but not curable. - Violaceous macules and patches - Benign - Related to trauma, age, sun damage and/or use of blood thinners, chronic use of topical and/or oral steroids - Observe - Can use OTC arnica containing moisturizer such as Dermend Bruise Formula if desired - Call for worsening or other concerns  - arms  Return in about 6 months (around 07/11/2022) for check legs, txt ISKs legs, feet.  I, Othelia Pulling, RMA, am acting as scribe for Sarina Ser, MD . Documentation: I have reviewed the above documentation for accuracy and completeness, and I agree with the above.  Sarina Ser, MD

## 2022-01-23 ENCOUNTER — Encounter: Payer: Self-pay | Admitting: Dermatology

## 2022-06-12 ENCOUNTER — Other Ambulatory Visit: Payer: Self-pay

## 2022-06-12 ENCOUNTER — Emergency Department
Admission: EM | Admit: 2022-06-12 | Discharge: 2022-06-12 | Disposition: A | Discharge status: Home / Self Care | Payer: Medicare Other | Attending: Emergency Medicine | Admitting: Emergency Medicine

## 2022-06-12 ENCOUNTER — Emergency Department: Payer: Medicare Other

## 2022-06-12 DIAGNOSIS — W010XXA Fall on same level from slipping, tripping and stumbling without subsequent striking against object, initial encounter: Secondary | ICD-10-CM | POA: Diagnosis not present

## 2022-06-12 DIAGNOSIS — E119 Type 2 diabetes mellitus without complications: Secondary | ICD-10-CM | POA: Insufficient documentation

## 2022-06-12 DIAGNOSIS — S42201A Unspecified fracture of upper end of right humerus, initial encounter for closed fracture: Secondary | ICD-10-CM | POA: Diagnosis not present

## 2022-06-12 DIAGNOSIS — S42202A Unspecified fracture of upper end of left humerus, initial encounter for closed fracture: Secondary | ICD-10-CM

## 2022-06-12 DIAGNOSIS — W19XXXA Unspecified fall, initial encounter: Secondary | ICD-10-CM

## 2022-06-12 DIAGNOSIS — I10 Essential (primary) hypertension: Secondary | ICD-10-CM | POA: Diagnosis not present

## 2022-06-12 DIAGNOSIS — E039 Hypothyroidism, unspecified: Secondary | ICD-10-CM | POA: Diagnosis not present

## 2022-06-12 DIAGNOSIS — M25511 Pain in right shoulder: Secondary | ICD-10-CM | POA: Diagnosis present

## 2022-06-12 NOTE — ED Triage Notes (Addendum)
Pt to ED for mechanical fall while walking in uneven grass, states hit right upper arm.

## 2022-06-12 NOTE — Discharge Instructions (Signed)
You have sustained a proximal humerus fracture.  Please keep your arm in the sling.  Please follow-up with orthopedics.  Please return for any new, worsening, or change in symptoms or other concerns.  It was a pleasure caring for you today.

## 2022-06-12 NOTE — ED Notes (Signed)
Pt refused CT scan of cervical spine and head

## 2022-06-12 NOTE — ED Provider Notes (Signed)
[facility] Provider Note    Event Date/Time   First MD Initiated Contact with Patient 06/12/22 1450     (approximate)   History   Fall   HPI  Wanda Baird is a 89 y.o. female who presents today for evaluation after a fall.  Patient reports that she tripped on the grass while her son was mowing the lawn and she fell onto her right shoulder.  She reports that she felt pain immediately.  There was no head strike or LOC.  She does not take anticoagulation.  She has been able to walk since the accident.  No open wounds.  No headache or neck pain.  Patient Active Problem List   Diagnosis Date Noted   Fall 07/31/2018   HYPOTHYROIDISM 10/29/2006   DIABETES-TYPE 2 10/29/2006   HYPERTENSION, ESSENTIAL NOS 10/29/2006   OSTEOPOROSIS 10/29/2006          Physical Exam   Triage Vital Signs: ED Triage Vitals  Enc Vitals Group     BP 06/12/22 1240 (!) 166/80     Pulse Rate 06/12/22 1240 76     Resp 06/12/22 1240 16     Temp 06/12/22 1240 98.2 F (36.8 C)     Temp src --      SpO2 06/12/22 1240 97 %     Weight 06/12/22 1241 140 lb (63.5 kg)     Height 06/12/22 1241  (1.626 m)     Head Circumference --      Peak Flow --      Pain Score 06/12/22 1240 7     Pain Loc --      Pain Edu? --      Excl. in GC? --     Most recent vital signs: Vitals:   06/12/22 1240 06/12/22 1528  BP: (!) 166/80 (!) 158/78  Pulse: 76 78  Resp: 16 18  Temp: 98.2 F (36.8 C) 98.2 F (36.8 C)  SpO2: 97% 98%    Physical Exam Vitals and nursing note reviewed.  Constitutional:      General: Awake and alert. No acute distress.    Appearance: Normal appearance. The patient is normal weight.  HENT:     Head: Normocephalic and atraumatic.     Mouth: Mucous membranes are moist.  Eyes:     General: PERRL. Normal EOMs        Right eye: No discharge.        Left eye: No discharge.     Conjunctiva/sclera: Conjunctivae normal.  Cardiovascular:     Rate and  Rhythm: Normal rate and regular rhythm.     Pulses: Normal pulses.  Pulmonary:     Effort: Pulmonary effort is normal. No respiratory distress.     Breath sounds: Normal breath sounds.  Abdominal:     Abdomen is soft. There is no abdominal tenderness. No rebound or guarding. No distention. Musculoskeletal:        General: No swelling. Normal range of motion.     Cervical back: Normal range of motion and neck supple. No midline cervical spine tenderness.  Full range of motion of neck.  Negative Spurling test.  Negative Lhermitte sign.  Normal strength and sensation in bilateral upper extremities. Normal grip strength bilaterally.  Normal intrinsic muscle function of the hand bilaterally.  Normal radial pulses bilaterally. Right upper extremity: Tenderness to her proximal humerus, though no obvious deformity, erythema, or ecchymosis.  No tenderness to her elbow or wrist.  No  forearm tenderness.  Patient has full normal range of motion of her elbow and her wrist.  No snuffbox tenderness.  Normal grip strength. Skin:    General: Skin is warm and dry.     Capillary Refill: Capillary refill takes less than 2 seconds.     Findings: No rash.  Neurological:     Mental Status: The patient is awake and alert.   Neurological: GCS 15 alert and oriented x3 Normal speech, no expressive or receptive aphasia or dysarthria Cranial nerves II through XII intact Normal visual fields 5 out of 5 strength in all 4 extremities with intact sensation throughout, except decreased range of motion to her right arm secondary to her injury No extremity drift Normal finger-to-nose testing, no limb or truncal ataxia   ED Results / Procedures / Treatments   Labs (all labs ordered are listed, but only abnormal results are displayed) Labs Reviewed - No data to display   EKG     RADIOLOGY I independently reviewed and interpreted imaging and agree with radiologists findings.     PROCEDURES:  Critical Care  performed:   Procedures   MEDICATIONS ORDERED IN ED: Medications - No data to display   IMPRESSION / MDM / ASSESSMENT AND PLAN / ED COURSE  I reviewed the triage vital signs and the nursing notes.   Differential diagnosis includes, but is not limited to, humerus fracture, dislocation, contusion.  Patient is awake and alert, hemodynamically stable and afebrile.  She is neurovascularly intact.  She is quite sharp and appears younger than stated age.  There is no obvious deformity on exam.  She has normal radial pulse.  She has normal grip strength.  She declines CT head and neck, reports that she did not hit her head.  She has no headache or cervical spine tenderness.  Initial recommended CT head and neck given her age and the mechanism of injury, but she declined.  She understands the risk of declining.  X-ray of her shoulder reveals a nondisplaced impaction fracture.  Discussed these findings with the patient.  She was placed in a sling for comfort.  She was instructed to follow-up with orthopedics.  We discussed symptomatic management in the meantime.  Patient understands and agrees with plan.  She was discharged in stable condition.  She is ambulatory with a steady gait.  She was discharged in the care of her son.   Patient's presentation is most consistent with acute complicated illness / injury requiring diagnostic workup.      FINAL CLINICAL IMPRESSION(S) / ED DIAGNOSES   Final diagnoses:  Closed fracture of proximal end of left humerus, unspecified fracture morphology, initial encounter  Fall, initial encounter     Rx / DC Orders   ED Discharge Orders     None        Note:  This document was prepared using Dragon voice recognition software and may include unintentional dictation errors.   Jackelyn Hoehn, PA-C 06/12/22 1753    Georga Hacking, MD 06/13/22 2029

## 2022-08-06 ENCOUNTER — Other Ambulatory Visit: Payer: Self-pay | Admitting: Orthopedic Surgery

## 2022-08-06 DIAGNOSIS — M4856XD Collapsed vertebra, not elsewhere classified, lumbar region, subsequent encounter for fracture with routine healing: Secondary | ICD-10-CM

## 2022-08-09 ENCOUNTER — Encounter
Admission: RE | Admit: 2022-08-09 | Discharge: 2022-08-09 | Disposition: A | Discharge status: Home / Self Care | Payer: Medicare Other | Source: Ambulatory Visit | Attending: Orthopedic Surgery | Admitting: Orthopedic Surgery

## 2022-08-09 DIAGNOSIS — M4856XD Collapsed vertebra, not elsewhere classified, lumbar region, subsequent encounter for fracture with routine healing: Secondary | ICD-10-CM | POA: Diagnosis present

## 2022-08-09 MED ORDER — TECHNETIUM TC 99M MEDRONATE IV KIT
22.5500 | PACK | Freq: Once | INTRAVENOUS | Status: AC | PRN
  Administered 2022-08-09: 22.55 via INTRAVENOUS

## 2022-08-15 ENCOUNTER — Ambulatory Visit: Payer: Medicare Other | Admitting: Dermatology

## 2022-08-18 ENCOUNTER — Emergency Department: Payer: Medicare Other

## 2022-08-18 ENCOUNTER — Inpatient Hospital Stay
Admission: EM | Admit: 2022-08-18 | Discharge: 2022-08-24 | DRG: 522 | Disposition: A | Discharge status: Skilled Nursing Facility | Payer: Medicare Other | Attending: Internal Medicine | Admitting: Internal Medicine

## 2022-08-18 ENCOUNTER — Other Ambulatory Visit: Payer: Self-pay

## 2022-08-18 DIAGNOSIS — Z01810 Encounter for preprocedural cardiovascular examination: Secondary | ICD-10-CM

## 2022-08-18 DIAGNOSIS — S72009A Fracture of unspecified part of neck of unspecified femur, initial encounter for closed fracture: Secondary | ICD-10-CM | POA: Diagnosis present

## 2022-08-18 DIAGNOSIS — W19XXXA Unspecified fall, initial encounter: Secondary | ICD-10-CM

## 2022-08-18 DIAGNOSIS — E1169 Type 2 diabetes mellitus with other specified complication: Secondary | ICD-10-CM | POA: Diagnosis not present

## 2022-08-18 DIAGNOSIS — E876 Hypokalemia: Secondary | ICD-10-CM | POA: Diagnosis present

## 2022-08-18 DIAGNOSIS — Y92009 Unspecified place in unspecified non-institutional (private) residence as the place of occurrence of the external cause: Secondary | ICD-10-CM | POA: Diagnosis not present

## 2022-08-18 DIAGNOSIS — S72012A Unspecified intracapsular fracture of left femur, initial encounter for closed fracture: Principal | ICD-10-CM | POA: Diagnosis present

## 2022-08-18 DIAGNOSIS — D696 Thrombocytopenia, unspecified: Secondary | ICD-10-CM | POA: Diagnosis not present

## 2022-08-18 DIAGNOSIS — Z79890 Hormone replacement therapy (postmenopausal): Secondary | ICD-10-CM

## 2022-08-18 DIAGNOSIS — Y92096 Garden or yard of other non-institutional residence as the place of occurrence of the external cause: Secondary | ICD-10-CM | POA: Diagnosis not present

## 2022-08-18 DIAGNOSIS — I10 Essential (primary) hypertension: Secondary | ICD-10-CM | POA: Diagnosis present

## 2022-08-18 DIAGNOSIS — Z803 Family history of malignant neoplasm of breast: Secondary | ICD-10-CM

## 2022-08-18 DIAGNOSIS — M4856XD Collapsed vertebra, not elsewhere classified, lumbar region, subsequent encounter for fracture with routine healing: Secondary | ICD-10-CM | POA: Diagnosis present

## 2022-08-18 DIAGNOSIS — D62 Acute posthemorrhagic anemia: Secondary | ICD-10-CM | POA: Diagnosis not present

## 2022-08-18 DIAGNOSIS — D509 Iron deficiency anemia, unspecified: Secondary | ICD-10-CM | POA: Diagnosis present

## 2022-08-18 DIAGNOSIS — Z885 Allergy status to narcotic agent status: Secondary | ICD-10-CM | POA: Diagnosis not present

## 2022-08-18 DIAGNOSIS — R197 Diarrhea, unspecified: Secondary | ICD-10-CM | POA: Diagnosis not present

## 2022-08-18 DIAGNOSIS — Z85828 Personal history of other malignant neoplasm of skin: Secondary | ICD-10-CM | POA: Diagnosis not present

## 2022-08-18 DIAGNOSIS — S51011A Laceration without foreign body of right elbow, initial encounter: Secondary | ICD-10-CM | POA: Diagnosis present

## 2022-08-18 DIAGNOSIS — Z602 Problems related to living alone: Secondary | ICD-10-CM | POA: Diagnosis present

## 2022-08-18 DIAGNOSIS — Z79899 Other long term (current) drug therapy: Secondary | ICD-10-CM

## 2022-08-18 DIAGNOSIS — E860 Dehydration: Secondary | ICD-10-CM | POA: Diagnosis present

## 2022-08-18 DIAGNOSIS — E871 Hypo-osmolality and hyponatremia: Secondary | ICD-10-CM | POA: Diagnosis present

## 2022-08-18 DIAGNOSIS — K2970 Gastritis, unspecified, without bleeding: Secondary | ICD-10-CM | POA: Diagnosis present

## 2022-08-18 DIAGNOSIS — E785 Hyperlipidemia, unspecified: Secondary | ICD-10-CM

## 2022-08-18 DIAGNOSIS — E1165 Type 2 diabetes mellitus with hyperglycemia: Secondary | ICD-10-CM | POA: Diagnosis not present

## 2022-08-18 DIAGNOSIS — E039 Hypothyroidism, unspecified: Secondary | ICD-10-CM | POA: Diagnosis present

## 2022-08-18 DIAGNOSIS — Z7984 Long term (current) use of oral hypoglycemic drugs: Secondary | ICD-10-CM

## 2022-08-18 DIAGNOSIS — Y998 Other external cause status: Secondary | ICD-10-CM

## 2022-08-18 DIAGNOSIS — W010XXA Fall on same level from slipping, tripping and stumbling without subsequent striking against object, initial encounter: Secondary | ICD-10-CM | POA: Diagnosis present

## 2022-08-18 DIAGNOSIS — Z888 Allergy status to other drugs, medicaments and biological substances status: Secondary | ICD-10-CM

## 2022-08-18 DIAGNOSIS — S72002A Fracture of unspecified part of neck of left femur, initial encounter for closed fracture: Secondary | ICD-10-CM | POA: Diagnosis not present

## 2022-08-18 DIAGNOSIS — E119 Type 2 diabetes mellitus without complications: Secondary | ICD-10-CM

## 2022-08-18 LAB — CBC WITH DIFFERENTIAL/PLATELET
Abs Immature Granulocytes: 0.09 10*3/uL — ABNORMAL HIGH (ref 0.00–0.07)
Basophils Absolute: 0 10*3/uL (ref 0.0–0.1)
Basophils Relative: 0 %
Eosinophils Absolute: 0 10*3/uL (ref 0.0–0.5)
Eosinophils Relative: 0 %
HCT: 37.4 % (ref 36.0–46.0)
Hemoglobin: 13 g/dL (ref 12.0–15.0)
Immature Granulocytes: 1 %
Lymphocytes Relative: 13 %
Lymphs Abs: 1.2 10*3/uL (ref 0.7–4.0)
MCH: 30.5 pg (ref 26.0–34.0)
MCHC: 34.8 g/dL (ref 30.0–36.0)
MCV: 87.8 fL (ref 80.0–100.0)
Monocytes Absolute: 0.6 10*3/uL (ref 0.1–1.0)
Monocytes Relative: 6 %
Neutro Abs: 7.8 10*3/uL — ABNORMAL HIGH (ref 1.7–7.7)
Neutrophils Relative %: 80 %
Platelets: 188 10*3/uL (ref 150–400)
RBC: 4.26 MIL/uL (ref 3.87–5.11)
RDW: 12.3 % (ref 11.5–15.5)
WBC: 9.8 10*3/uL (ref 4.0–10.5)
nRBC: 0 % (ref 0.0–0.2)

## 2022-08-18 LAB — PROTIME-INR
INR: 1 (ref 0.8–1.2)
Prothrombin Time: 13.8 seconds (ref 11.4–15.2)

## 2022-08-18 LAB — CBG MONITORING, ED
Glucose-Capillary: 217 mg/dL — ABNORMAL HIGH (ref 70–99)
Glucose-Capillary: 129 mg/dL — ABNORMAL HIGH (ref 70–99)
Glucose-Capillary: 127 mg/dL — ABNORMAL HIGH (ref 70–99)

## 2022-08-18 LAB — URINALYSIS, ROUTINE W REFLEX MICROSCOPIC
Bacteria, UA: NONE SEEN
Bilirubin Urine: NEGATIVE
Glucose, UA: 50 mg/dL — AB
Hgb urine dipstick: NEGATIVE
Ketones, ur: NEGATIVE mg/dL
Leukocytes,Ua: NEGATIVE
Nitrite: NEGATIVE
Protein, ur: NEGATIVE mg/dL
Specific Gravity, Urine: 1.012 (ref 1.005–1.030)
Squamous Epithelial / HPF: NONE SEEN /HPF (ref 0–5)
pH: 7 (ref 5.0–8.0)

## 2022-08-18 LAB — BASIC METABOLIC PANEL
Anion gap: 9 (ref 5–15)
BUN: 12 mg/dL (ref 8–23)
CO2: 25 mmol/L (ref 22–32)
Calcium: 7.9 mg/dL — ABNORMAL LOW (ref 8.9–10.3)
Chloride: 96 mmol/L — ABNORMAL LOW (ref 98–111)
Creatinine, Ser: 0.43 mg/dL — ABNORMAL LOW (ref 0.44–1.00)
GFR, Estimated: >60 mL/min (ref >60)
Glucose, Bld: 127 mg/dL — ABNORMAL HIGH (ref 70–99)
Potassium: 3.2 mmol/L — ABNORMAL LOW (ref 3.5–5.1)
Sodium: 130 mmol/L — ABNORMAL LOW (ref 135–145)

## 2022-08-18 LAB — COMPREHENSIVE METABOLIC PANEL
ALT: 22 U/L (ref 0–44)
AST: 25 U/L (ref 15–41)
Albumin: 3.5 g/dL (ref 3.5–5.0)
Alkaline Phosphatase: 55 U/L (ref 38–126)
Anion gap: 12 (ref 5–15)
BUN: 18 mg/dL (ref 8–23)
CO2: 25 mmol/L (ref 22–32)
Calcium: 8.9 mg/dL (ref 8.9–10.3)
Chloride: 96 mmol/L — ABNORMAL LOW (ref 98–111)
Creatinine, Ser: 0.62 mg/dL (ref 0.44–1.00)
GFR, Estimated: >60 mL/min (ref >60)
Glucose, Bld: 238 mg/dL — ABNORMAL HIGH (ref 70–99)
Potassium: 2.7 mmol/L — CL (ref 3.5–5.1)
Sodium: 133 mmol/L — ABNORMAL LOW (ref 135–145)
Total Bilirubin: 1.9 mg/dL — ABNORMAL HIGH (ref 0.3–1.2)
Total Protein: 6.3 g/dL — ABNORMAL LOW (ref 6.5–8.1)

## 2022-08-18 LAB — TYPE AND SCREEN
ABO/RH(D): O NEG
Antibody Screen: NEGATIVE

## 2022-08-18 LAB — MAGNESIUM: Magnesium: 2 mg/dL (ref 1.7–2.4)

## 2022-08-18 LAB — HEMOGLOBIN A1C
Hgb A1c MFr Bld: 7.3 % — ABNORMAL HIGH (ref 4.8–5.6)
Mean Plasma Glucose: 162.81 mg/dL

## 2022-08-18 MED ORDER — ONDANSETRON HCL 4 MG PO TABS: 4.0000 mg | ORAL_TABLET | Freq: Four times a day (QID) | ORAL | Status: DC | PRN

## 2022-08-18 MED ORDER — FENTANYL CITRATE PF 50 MCG/ML IJ SOSY
50.0000 ug | PREFILLED_SYRINGE | INTRAMUSCULAR | Status: DC | PRN
  Administered 2022-08-18: 50 ug via INTRAVENOUS
  Filled 2022-08-18: qty 1

## 2022-08-18 MED ORDER — POTASSIUM CHLORIDE IN NACL 20-0.9 MEQ/L-% IV SOLN
INTRAVENOUS | Status: DC
  Filled 2022-08-18 (×6): qty 1000

## 2022-08-18 MED ORDER — SODIUM CHLORIDE 0.9 % IV SOLN
12.5000 mg | Freq: Four times a day (QID) | INTRAVENOUS | Status: DC | PRN
  Administered 2022-08-18 – 2022-08-22 (×9): 12.5 mg via INTRAVENOUS
  Filled 2022-08-18 (×3): qty 0.5
  Filled 2022-08-18 (×4): qty 12.5
  Filled 2022-08-18: qty 0.5
  Filled 2022-08-18: qty 12.5
  Filled 2022-08-18: qty 0.5

## 2022-08-18 MED ORDER — INSULIN ASPART 100 UNIT/ML IJ SOLN
0.0000 [IU] | INTRAMUSCULAR | Status: DC
  Administered 2022-08-18: 3 [IU] via SUBCUTANEOUS
  Administered 2022-08-19: 2 [IU] via SUBCUTANEOUS
  Administered 2022-08-19: 3 [IU] via SUBCUTANEOUS
  Administered 2022-08-19: 2 [IU] via SUBCUTANEOUS
  Administered 2022-08-19: 3 [IU] via SUBCUTANEOUS
  Administered 2022-08-20: 2 [IU] via SUBCUTANEOUS
  Administered 2022-08-20: 1 [IU] via SUBCUTANEOUS
  Administered 2022-08-20: 2 [IU] via SUBCUTANEOUS
  Administered 2022-08-20: 3 [IU] via SUBCUTANEOUS
  Administered 2022-08-21: 2 [IU] via SUBCUTANEOUS
  Administered 2022-08-21: 1 [IU] via SUBCUTANEOUS
  Administered 2022-08-21: 2 [IU] via SUBCUTANEOUS
  Administered 2022-08-21: 3 [IU] via SUBCUTANEOUS
  Administered 2022-08-21: 2 [IU] via SUBCUTANEOUS
  Administered 2022-08-22 (×2): 1 [IU] via SUBCUTANEOUS
  Filled 2022-08-18 (×14): qty 1

## 2022-08-18 MED ORDER — TRANEXAMIC ACID-NACL 1000-0.7 MG/100ML-% IV SOLN
1000.0000 mg | INTRAVENOUS | Status: DC
  Filled 2022-08-18: qty 100

## 2022-08-18 MED ORDER — ONDANSETRON HCL 4 MG/2ML IJ SOLN
4.0000 mg | Freq: Four times a day (QID) | INTRAMUSCULAR | Status: DC | PRN
  Administered 2022-08-18 – 2022-08-19 (×3): 4 mg via INTRAVENOUS
  Filled 2022-08-18 (×3): qty 2

## 2022-08-18 MED ORDER — CEFAZOLIN SODIUM-DEXTROSE 2-4 GM/100ML-% IV SOLN: 2.0000 g | INTRAVENOUS | Status: DC

## 2022-08-18 MED ORDER — CEFAZOLIN SODIUM-DEXTROSE 2-4 GM/100ML-% IV SOLN
2.0000 g | INTRAVENOUS | Status: AC
  Administered 2022-08-19: 2 g via INTRAVENOUS

## 2022-08-18 MED ORDER — MORPHINE SULFATE (PF) 2 MG/ML IV SOLN
1.0000 mg | INTRAVENOUS | Status: DC | PRN
  Filled 2022-08-18: qty 1

## 2022-08-18 MED ORDER — SODIUM CHLORIDE 0.9 % IV BOLUS
1000.0000 mL | Freq: Once | INTRAVENOUS | Status: AC
  Administered 2022-08-18: 1000 mL via INTRAVENOUS

## 2022-08-18 MED ORDER — POTASSIUM CHLORIDE 10 MEQ/100ML IV SOLN
10.0000 meq | INTRAVENOUS | Status: AC
  Administered 2022-08-18 (×4): 10 meq via INTRAVENOUS
  Filled 2022-08-18 (×2): qty 100

## 2022-08-18 MED ORDER — SODIUM CHLORIDE 0.9 % IV SOLN: INTRAVENOUS | Status: DC

## 2022-08-18 MED ORDER — TRANEXAMIC ACID-NACL 1000-0.7 MG/100ML-% IV SOLN
1000.0000 mg | INTRAVENOUS | Status: AC
  Administered 2022-08-19: 1000 mg via INTRAVENOUS
  Filled 2022-08-18: qty 100

## 2022-08-18 MED ORDER — HYDROMORPHONE HCL 1 MG/ML IJ SOLN
1.0000 mg | INTRAMUSCULAR | Status: DC | PRN
  Administered 2022-08-18 – 2022-08-19 (×4): 1 mg via INTRAVENOUS
  Filled 2022-08-18 (×6): qty 1

## 2022-08-18 NOTE — ED Provider Notes (Signed)
[facility] Provider Note   Event Date/Time   First MD Initiated Contact with Patient 08/18/22 1041     (approximate) History  Fall  HPI Wanda Baird is a 89 y.o. female with a past medical history of type 2 diabetes, hypertension, and hypothyroidism who presents after mechanical fall in her garden suffering a significant pain to the left hip.  Patient states that she drank her self and side on her elbows and suffered a small skin tear to the right elbow prior to calling 911.  Patient now endorses 8/10, sharp, nonradiating pain at the left lateral hip that is worse with any movement ROS: Patient currently denies any vision changes, tinnitus, difficulty speaking, facial droop, sore throat, chest pain, shortness of breath, abdominal pain, nausea/vomiting/diarrhea, dysuria, or weakness/numbness/paresthesias in any extremity   Physical Exam  Triage Vital Signs: ED Triage Vitals  Enc Vitals Group     BP 08/18/22 1038 (!) 156/76     Pulse Rate 08/18/22 1038 83     Resp 08/18/22 1038 16     Temp 08/18/22 1038 97.7 F (36.5 C)     Temp Source 08/18/22 1038 Oral     SpO2 08/18/22 1038 98 %     Weight 08/18/22 1037 132 lb (59.9 kg)     Height 08/18/22 1037 5\' 4"  (1.626 m)     Head Circumference --      Peak Flow --      Pain Score 08/18/22 1036 8     Pain Loc --      Pain Edu? --      Excl. in GC? --    Most recent vital signs: Vitals:   08/18/22 1038  BP: (!) 156/76  Pulse: 83  Resp: 16  Temp: 97.7 F (36.5 C)  SpO2: 98%   General: Awake, oriented x4. CV:  Good peripheral perfusion.  Resp:  Normal effort.  Abd:  No distention.  Other:  Well-developed, Caucasian female laying in bed and in mild distress secondary to left hip pain.  Tenderness to palpation over the left hip with significantly limited range of motion secondary to pain.  Patient is distally neurovascularly intact ED Results / Procedures / Treatments  Labs (all labs ordered are  listed, but only abnormal results are displayed) Labs Reviewed  CBC WITH DIFFERENTIAL/PLATELET - Abnormal; Notable for the following components:      Result Value   Neutro Abs 7.8 (*)    Abs Immature Granulocytes 0.09 (*)    All other components within normal limits  COMPREHENSIVE METABOLIC PANEL - Abnormal; Notable for the following components:   Sodium 133 (*)    Potassium 2.7 (*)    Chloride 96 (*)    Glucose, Bld 238 (*)    Total Protein 6.3 (*)    Total Bilirubin 1.9 (*)    All other components within normal limits  TYPE AND SCREEN   EKG ED ECG REPORT I, Merwyn Katos, the attending physician, personally viewed and interpreted this ECG. Date: 08/18/2022 EKG Time: 1041 Rate: 84 Rhythm: normal sinus rhythm QRS Axis: normal Intervals: normal ST/T Wave abnormalities: normal Narrative Interpretation: no evidence of acute ischemia RADIOLOGY ED MD interpretation: X-ray of the left hip shows a an acute subcapital left femoral neck fracture.  This was interpreted independently by me  Single view portable chest x-ray interpreted by me shows no evidence of acute abnormalities including no pneumonia, pneumothorax, or widened mediastinum -Agree with radiology assessment Official radiology report(s):  DG Chest Port 1 View  Result Date: 08/18/2022 CLINICAL DATA:  Fall. EXAM: PORTABLE CHEST 1 VIEW COMPARISON:  May 26, 2020. FINDINGS: The heart size and mediastinal contours are within normal limits. Both lungs are clear. The visualized skeletal structures are unremarkable. IMPRESSION: No active disease. Electronically Signed   By: Lupita Raider M.D.   On: 08/18/2022 11:11   DG Hip Unilat W or Wo Pelvis 2-3 Views Left  Result Date: 08/18/2022 CLINICAL DATA:  Status post fall with severe pain. EXAM: DG HIP (WITH OR WITHOUT PELVIS) 2-3V LEFT COMPARISON:  None Available. FINDINGS: There is an acute, subcapital left femoral neck fracture. Proximal displacement of the distal fracture  fragments. No sign of dislocation. Right hip appears located and intact. No signs of pelvic diastasis or fracture. IMPRESSION: Acute subcapital left femoral neck fracture. Electronically Signed   By: Signa Kell M.D.   On: 08/18/2022 11:11   PROCEDURES: Critical Care performed: No .1-3 Lead EKG Interpretation  Performed by: Merwyn Katos, MD Authorized by: Merwyn Katos, MD     Interpretation: normal     ECG rate:  71   ECG rate assessment: normal     Rhythm: sinus rhythm     Ectopy: none     Conduction: normal    MEDICATIONS ORDERED IN ED: Medications  promethazine (PHENERGAN) 12.5 mg in sodium chloride 0.9 % 50 mL IVPB (has no administration in time range)  HYDROmorphone (DILAUDID) injection 1 mg (1 mg Intravenous Given 08/18/22 1125)  potassium chloride 10 mEq in 100 mL IVPB (has no administration in time range)  sodium chloride 0.9 % bolus 1,000 mL (has no administration in time range)   IMPRESSION / MDM / ASSESSMENT AND PLAN / ED COURSE  I reviewed the triage vital signs and the nursing notes.                             The patient is on the cardiac monitor to evaluate for evidence of arrhythmia and/or significant heart rate changes. Patient's presentation is most consistent with acute presentation with potential threat to life or bodily function. Patient is a 89 year old female that presents for left hip pain Workup: XR hip Findings: Left subcapital femoral fracture without dislocation Consult: Orthopedic Surgery (query fascia iliacus block vs continued opiate pain control), hospitalist  Patient does not currently demonstrate complications of fracture such as compartment syndrome, arterial or nerve injury.  Interventions: analgesia Disposition: Admit   FINAL CLINICAL IMPRESSION(S) / ED DIAGNOSES   Final diagnoses:  Fall, initial encounter  Pre-operative cardiovascular examination   Rx / DC Orders   ED Discharge Orders     None      Note:  This document  was prepared using Dragon voice recognition software and may include unintentional dictation errors.   Merwyn Katos, MD 08/18/22 [phone]

## 2022-08-18 NOTE — Assessment & Plan Note (Signed)
Synthroid 

## 2022-08-18 NOTE — Assessment & Plan Note (Signed)
SSI  A1C  

## 2022-08-18 NOTE — Assessment & Plan Note (Signed)
On low dose every other day crestor

## 2022-08-18 NOTE — ED Notes (Signed)
Pt O2 dropped to 86% when sleeping. This RN placed pt on 2L [place] and pt O2sat at 100%

## 2022-08-18 NOTE — ED Triage Notes (Signed)
Pt to ED from home [facility] for mechanical fall to concrete, complains of L hip and thigh pain. Shortening and rotation to L leg noted by EMS Alert and oriented, no thinners, no head trauma  8mg  zofran given by EMS fentanyl by EMS 18# L AC  119/57, 98%, HR 87, CBG 280 has DM on oral meds Allergic to codeine

## 2022-08-18 NOTE — H&P (Signed)
History and Physical    Patient: Wanda Baird AOZ:[id number] DOB: [birth year] DOA: 08/18/2022 DOS: the patient was seen and examined on 08/18/2022 PCP: Kandyce Rud, MD  Patient coming from: Home  Chief Complaint:  Chief Complaint  Patient presents with   Fall   HPI: Wanda Baird is a 89 y.o. female with medical history significant of type 2 diabetes, hypertension, hypothyroidism presenting with mechanical fall, left hip fracture.  Patient reports working in her garden when she got up and tripped over a rock landing on her left hip.  Patient reports significant left sided pain after fall.  No reported head trauma loss consciousness.  No reported weakness or dizziness prior to fall.  No chest pain or shortness of breath.  No nausea or vomiting.  No abdominal pain or diarrhea.  Has had significant left hip pain and difficulty with ambulation after the fall.  Noted nondisplaced humerus impaction fracture of the right in April of this year from mechanical fall.  Patient noted currently living on shared properly in a separate house with her son. Presented to the ER afebrile, hemodynamically stable.  Satting well on room air.  White count 9.8, hemoglobin 13, platelets 190.  Creatinine 0.62.  Potassium 2.7.  Chest x-ray stable.  Hip plain films with acute subcapital left femoral neck fracture.  Dr. Hyacinth Meeker with orthopedic surgery consult will plan for operative repair today. Review of Systems: As mentioned in the history of present illness. All other systems reviewed and are negative. Past Medical History:  Diagnosis Date   Actinic keratosis    Diabetes mellitus without complication (HCC)    Hx of dysplastic nevus 06/29/2008   R distal lateral thigh, mild to moderate   Hypertension    Squamous cell carcinoma of skin 05/07/2016   R dorsum foot   Squamous cell carcinoma of skin 12/24/2013   SCC IS R dorsal hand   Squamous cell carcinoma of skin 10/10/2020   right medial ankle  (in situ) - [facility] 11/21/2020   Squamous cell carcinoma of skin 10/10/2020   left dorsum 2nd toe (in situ) - [facility] 11/21/2020   Squamous cell carcinoma of skin 11/21/2020   L hand dorsum - [facility]   Past Surgical History:  Procedure Laterality Date   BACK SURGERY     Social History:  reports that she has never smoked. She has never used smokeless tobacco. She reports that she does not drink alcohol and does not use drugs.  Allergies  Allergen Reactions   Metformin Diarrhea   Codeine     REACTION: nausea and vomiting   Percocet [Oxycodone-Acetaminophen] Nausea Only    Family History  Problem Relation Age of Onset   Breast cancer Maternal Aunt     Prior to Admission medications   Medication Sig Start Date End Date Taking? Authorizing Provider  Calcium Carbonate-Vitamin D3 600-400 MG-UNIT TABS Take 1 tablet by mouth daily.     [provider]  glimepiride (AMARYL) 1 MG tablet Take 1-2 mg by mouth See admin instructions. Take 2mg  by mouth every morning at breakfast and take 1mg  by mouth every evening at dinner 07/23/18   [provider]  levothyroxine (SYNTHROID) 88 MCG tablet Take 88 mcg by mouth daily. 05/14/18   [provider]  metoCLOPramide (REGLAN) 5 MG tablet Take 1 tablet (5 mg total) by mouth 3 (three) times daily for 10 days. 08/02/18 08/12/18  Katha Hamming, MD  mupirocin ointment (BACTROBAN) 2 % Apply 1 application topically daily. Qd  to wounds 01/11/21   Deirdre Evener, MD  ondansetron (ZOFRAN) 4 MG tablet Take 1 tablet (4 mg total) by mouth every 8 (eight) hours as needed for up to 10 doses for nausea or vomiting. 05/26/20   Gilles Chiquito, MD  rosuvastatin (CRESTOR) 5 MG tablet Take 5 mg by mouth every other day. 03/17/18   [provider]  traMADol (ULTRAM) 50 MG tablet Take 1 tablet (50 mg total) by mouth every 6 (six) hours as needed for moderate pain. 08/01/18   Shaune Pollack, MD  valsartan-hydrochlorothiazide (DIOVAN-HCT) 320-25 MG  tablet Take 1 tablet by mouth daily. 07/23/18 07/23/19  [provider]    Physical Exam: Vitals:   08/18/22 1037 08/18/22 1038  BP:  (!) 156/76  Pulse:  83  Resp:  16  Temp:  97.7 F (36.5 C)  TempSrc:  Oral  SpO2:  98%  Weight: 59.9 kg   Height: 5\' 4"  (1.626 m)    Physical Exam Constitutional:      Appearance: She is normal weight.  HENT:     Head: Normocephalic and atraumatic.     Nose: Nose normal.     Mouth/Throat:     Mouth: Mucous membranes are moist.  Eyes:     Pupils: Pupils are equal, round, and reactive to light.  Cardiovascular:     Rate and Rhythm: Normal rate and regular rhythm.  Pulmonary:     Effort: Pulmonary effort is normal.     Breath sounds: Normal breath sounds.  Abdominal:     General: Bowel sounds are normal.  Musculoskeletal:     Cervical back: Normal range of motion.     Comments: + TTP and decreased ROM of L hip   Skin:    General: Skin is warm.  Neurological:     General: No focal deficit present.  Psychiatric:        Mood and Affect: Mood normal.     Data Reviewed:  There are no new results to review at this time. DG Chest Port 1 View CLINICAL DATA:  Fall.  EXAM: PORTABLE CHEST 1 VIEW  COMPARISON:  May 26, 2020.  FINDINGS: The heart size and mediastinal contours are within normal limits. Both lungs are clear. The visualized skeletal structures are unremarkable.  IMPRESSION: No active disease.  Electronically Signed   By: Lupita Raider M.D.   On: 08/18/2022 11:11 DG Hip Unilat W or Wo Pelvis 2-3 Views Left CLINICAL DATA:  Status post fall with severe pain.  EXAM: DG HIP (WITH OR WITHOUT PELVIS) 2-3V LEFT  COMPARISON:  None Available.  FINDINGS: There is an acute, subcapital left femoral neck fracture. Proximal displacement of the distal fracture fragments. No sign of dislocation. Right hip appears located and intact. No signs of pelvic diastasis or fracture.  IMPRESSION: Acute subcapital left  femoral neck fracture.  Electronically Signed   By: Signa Kell M.D.   On: 08/18/2022 11:11  Lab Results  Component Value Date   WBC 9.8 08/18/2022   HGB 13.0 08/18/2022   HCT 37.4 08/18/2022   MCV 87.8 08/18/2022   PLT 188 08/18/2022   Last metabolic panel Lab Results  Component Value Date   GLUCOSE 238 (H) 08/18/2022   NA 133 (L) 08/18/2022   K 2.7 (LL) 08/18/2022   CL 96 (L) 08/18/2022   CO2 25 08/18/2022   BUN 18 08/18/2022   CREATININE 0.62 08/18/2022   GFRNONAA >60 08/18/2022   CALCIUM 8.9 08/18/2022   PROT  6.3 (L) 08/18/2022   ALBUMIN 3.5 08/18/2022   BILITOT 1.9 (H) 08/18/2022   ALKPHOS 55 08/18/2022   AST 25 08/18/2022   ALT 22 08/18/2022   ANIONGAP 12 08/18/2022    Assessment and Plan: * Hip fracture (HCC) Noted acute subcapital left femoral neck fracture on imaging s/p mechanical fall  Dr. Hyacinth Meeker w/ orthopedics has been consulted w/ plan for operative repair today  Pain control  Antiemetics  PT/OT eval   HLD (hyperlipidemia) On low dose every other day crestor   Fall Mechanical fall at home with noted acute subcapital left femoral neck fracture  No head trauma or LOC Fall precautions  PT/OT eval  Follow closely  HTN (hypertension) BP stable  Titrate home regimen   Type 2 diabetes mellitus (HCC) SSI  A1C   Hypothyroidism Synthroid    Greater than 50% was spent in counseling and coordination of care with patient Total encounter time 80 minutes or more     Advance Care Planning:   Code Status: Full Code   Consults: Orthopedic surgery w/ Dr. Hyacinth Meeker   Family Communication: Son at the bedside   Severity of Illness: The appropriate patient status for this patient is INPATIENT. Inpatient status is judged to be reasonable and necessary in order to provide the required intensity of service to ensure the patient's safety. The patient's presenting symptoms, physical exam findings, and initial radiographic and laboratory data in the  context of their chronic comorbidities is felt to place them at high risk for further clinical deterioration. Furthermore, it is not anticipated that the patient will be medically stable for discharge from the hospital within 2 midnights of admission.   * I certify that at the point of admission it is my clinical judgment that the patient will require inpatient hospital care spanning beyond 2 midnights from the point of admission due to high intensity of service, high risk for further deterioration and high frequency of surveillance required.*  Author: Floydene Flock, MD 08/18/2022 12:09 PM  For on call review [email or web address].

## 2022-08-18 NOTE — Assessment & Plan Note (Signed)
BP stable Titrate home regimen 

## 2022-08-18 NOTE — Assessment & Plan Note (Signed)
Noted acute subcapital left femoral neck fracture on imaging s/p mechanical fall  Dr. Hyacinth Meeker w/ orthopedics has been consulted w/ plan for operative repair today  Pain control  Antiemetics  PT/OT eval

## 2022-08-18 NOTE — Assessment & Plan Note (Signed)
Potassium 2.7 Replete Check mag level Follow

## 2022-08-18 NOTE — Assessment & Plan Note (Signed)
Mechanical fall at home with noted acute subcapital left femoral neck fracture  No head trauma or LOC Fall precautions  PT/OT eval  Follow closely

## 2022-08-18 NOTE — Consult Note (Signed)
ORTHOPAEDIC CONSULTATION  REQUESTING PHYSICIAN: Floydene Flock, MD  Chief Complaint: Left hip pain  HPI: Wanda Baird is a 89 y.o. female who complains of left hip pain after stumbling in her garden this morning and falling on the hip.  She crawled into the house and called for help.  She was brought to the emergency room where exam and x-rays revealed a displaced subcapital fracture of the left hip.  She is no other significant injuries.  She did have an injury to her right arm 6 weeks or so ago but this is doing well now.  Her son and daughter-in-law are present.  I discussed treatment options with them and recommended hemiarthroplasty to the to replace the displaced femoral head.  Risk and benefits and postop protocol were discussed with them.  Potassium is low at 2.7 and the operating room will not be open till this evening so we will plan on doing her surgery tomorrow morning.  Past Medical History:  Diagnosis Date   Actinic keratosis    Diabetes mellitus without complication (HCC)    Hx of dysplastic nevus 06/29/2008   R distal lateral thigh, mild to moderate   Hypertension    Squamous cell carcinoma of skin 05/07/2016   R dorsum foot   Squamous cell carcinoma of skin 12/24/2013   SCC IS R dorsal hand   Squamous cell carcinoma of skin 10/10/2020   right medial ankle (in situ) - [facility] 11/21/2020   Squamous cell carcinoma of skin 10/10/2020   left dorsum 2nd toe (in situ) - [facility] 11/21/2020   Squamous cell carcinoma of skin 11/21/2020   L hand dorsum - [facility]   Past Surgical History:  Procedure Laterality Date   BACK SURGERY     Social History   Socioeconomic History   Marital status: Widowed    Spouse name: Not on file   Number of children: Not on file   Years of education: Not on file   Highest education level: Not on file  Occupational History   Not on file  Tobacco Use   Smoking status: Never   Smokeless tobacco: Never  Substance and Sexual Activity    Alcohol use: No   Drug use: No   Sexual activity: Not on file  Other Topics Concern   Not on file  Social History Narrative   Not on file   Social Determinants of Health   Financial Resource Strain: Not on file  Food Insecurity: Not on file  Transportation Needs: Not on file  Physical Activity: Not on file  Stress: Not on file  Social Connections: Not on file   Family History  Problem Relation Age of Onset   Breast cancer Maternal Aunt    Allergies  Allergen Reactions   Metformin Diarrhea   Codeine     REACTION: nausea and vomiting   Percocet [Oxycodone-Acetaminophen] Nausea Only   Prior to Admission medications   Medication Sig Start Date End Date Taking? Authorizing Provider  Calcium Carbonate-Vitamin D3 600-400 MG-UNIT TABS Take 1 tablet by mouth daily.     [provider]  glimepiride (AMARYL) 1 MG tablet Take 1-2 mg by mouth See admin instructions. Take 2mg  by mouth every morning at breakfast and take 1mg  by mouth every evening at dinner 07/23/18   [provider]  levothyroxine (SYNTHROID) 88 MCG tablet Take 88 mcg by mouth daily. 05/14/18   [provider]  metoCLOPramide (REGLAN) 5 MG tablet Take 1 tablet (5 mg total) by  mouth 3 (three) times daily for 10 days. 08/02/18 08/12/18  Katha Hamming, MD  mupirocin ointment (BACTROBAN) 2 % Apply 1 application topically daily. Qd to wounds 01/11/21   Deirdre Evener, MD  ondansetron (ZOFRAN) 4 MG tablet Take 1 tablet (4 mg total) by mouth every 8 (eight) hours as needed for up to 10 doses for nausea or vomiting. 05/26/20   Gilles Chiquito, MD  rosuvastatin (CRESTOR) 5 MG tablet Take 5 mg by mouth every other day. 03/17/18   [provider]  traMADol (ULTRAM) 50 MG tablet Take 1 tablet (50 mg total) by mouth every 6 (six) hours as needed for moderate pain. 08/01/18   Shaune Pollack, MD  valsartan-hydrochlorothiazide (DIOVAN-HCT) 320-25 MG tablet Take 1 tablet by mouth daily. 07/23/18 07/23/19   [provider]   DG Chest Port 1 View  Result Date: 08/18/2022 CLINICAL DATA:  Fall. EXAM: PORTABLE CHEST 1 VIEW COMPARISON:  May 26, 2020. FINDINGS: The heart size and mediastinal contours are within normal limits. Both lungs are clear. The visualized skeletal structures are unremarkable. IMPRESSION: No active disease. Electronically Signed   By: Lupita Raider M.D.   On: 08/18/2022 11:11   DG Hip Unilat W or Wo Pelvis 2-3 Views Left  Result Date: 08/18/2022 CLINICAL DATA:  Status post fall with severe pain. EXAM: DG HIP (WITH OR WITHOUT PELVIS) 2-3V LEFT COMPARISON:  None Available. FINDINGS: There is an acute, subcapital left femoral neck fracture. Proximal displacement of the distal fracture fragments. No sign of dislocation. Right hip appears located and intact. No signs of pelvic diastasis or fracture. IMPRESSION: Acute subcapital left femoral neck fracture. Electronically Signed   By: Signa Kell M.D.   On: 08/18/2022 11:11    Positive ROS: All other systems have been reviewed and were otherwise negative with the exception of those mentioned in the HPI and as above.  Physical Exam: General: Alert, no acute distress Cardiovascular: No pedal edema Respiratory: No cyanosis, no use of accessory musculature GI: No organomegaly, abdomen is soft and non-tender Skin: No lesions in the area of chief complaint Neurologic: Sensation intact distally Psychiatric: Patient is competent for consent with normal mood and affect Lymphatic: No axillary or cervical lymphadenopathy  MUSCULOSKELETAL: Patient is alert fully oriented and cooperative.  The left leg is slightly shortened and externally rotated.  There is severe pain with movement of the hip.  Neurovascular status good distally and the skin is intact.  Right lower extremity is normal.  Both upper extremities are normal to exam today.  Chest and back are nontender.  Assessment: Displaced subcapital fracture left  hip Hypokalemia  Plan: Left hip hemiarthroplasty tomorrow morning. Replace potassium levels.    Valinda Hoar, MD [phone]   08/18/2022 1:15 PM

## 2022-08-19 ENCOUNTER — Inpatient Hospital Stay: Payer: Medicare Other

## 2022-08-19 ENCOUNTER — Other Ambulatory Visit: Payer: Self-pay

## 2022-08-19 ENCOUNTER — Inpatient Hospital Stay: Payer: Medicare Other | Admitting: General Practice

## 2022-08-19 ENCOUNTER — Encounter
Admission: EM | Disposition: A | Discharge status: Skilled Nursing Facility | Payer: Self-pay | Source: Home / Self Care | Attending: Internal Medicine

## 2022-08-19 DIAGNOSIS — M4856XD Collapsed vertebra, not elsewhere classified, lumbar region, subsequent encounter for fracture with routine healing: Secondary | ICD-10-CM | POA: Diagnosis not present

## 2022-08-19 DIAGNOSIS — S72002A Fracture of unspecified part of neck of left femur, initial encounter for closed fracture: Secondary | ICD-10-CM

## 2022-08-19 HISTORY — PX: HIP ARTHROPLASTY: SHX981

## 2022-08-19 LAB — CBG MONITORING, ED
Glucose-Capillary: 145 mg/dL — ABNORMAL HIGH (ref 70–99)
Glucose-Capillary: 156 mg/dL — ABNORMAL HIGH (ref 70–99)
Glucose-Capillary: 164 mg/dL — ABNORMAL HIGH (ref 70–99)
Glucose-Capillary: 178 mg/dL — ABNORMAL HIGH (ref 70–99)

## 2022-08-19 LAB — CBC
HCT: 35.1 % — ABNORMAL LOW (ref 36.0–46.0)
Hemoglobin: 11.7 g/dL — ABNORMAL LOW (ref 12.0–15.0)
MCH: 30.2 pg (ref 26.0–34.0)
MCHC: 33.3 g/dL (ref 30.0–36.0)
MCV: 90.7 fL (ref 80.0–100.0)
Platelets: 157 10*3/uL (ref 150–400)
RBC: 3.87 MIL/uL (ref 3.87–5.11)
RDW: 12.5 % (ref 11.5–15.5)
WBC: 10 10*3/uL (ref 4.0–10.5)
nRBC: 0 % (ref 0.0–0.2)

## 2022-08-19 LAB — COMPREHENSIVE METABOLIC PANEL
Alkaline Phosphatase: 52 U/L (ref 38–126)
Sodium: 129 mmol/L — ABNORMAL LOW (ref 135–145)
Total Bilirubin: 2 mg/dL — ABNORMAL HIGH (ref 0.3–1.2)

## 2022-08-19 LAB — GLUCOSE, CAPILLARY
Glucose-Capillary: 178 mg/dL — ABNORMAL HIGH (ref 70–99)
Glucose-Capillary: 237 mg/dL — ABNORMAL HIGH (ref 70–99)
Glucose-Capillary: 250 mg/dL — ABNORMAL HIGH (ref 70–99)

## 2022-08-19 SURGERY — HEMIARTHROPLASTY, HIP, DIRECT ANTERIOR APPROACH, FOR FRACTURE
Anesthesia: General | Site: Hip | Laterality: Left

## 2022-08-19 MED ORDER — LIDOCAINE HCL (PF) 2 % IJ SOLN
INTRAMUSCULAR | Status: AC
  Filled 2022-08-19: qty 5

## 2022-08-19 MED ORDER — HYDROCODONE-ACETAMINOPHEN 7.5-325 MG PO TABS: 1.0000 | ORAL_TABLET | ORAL | Status: DC | PRN

## 2022-08-19 MED ORDER — METHOCARBAMOL 1000 MG/10ML IJ SOLN: 500.0000 mg | Freq: Four times a day (QID) | INTRAVENOUS | Status: DC | PRN

## 2022-08-19 MED ORDER — PHENOL 1.4 % MT LIQD
1.0000 | OROMUCOSAL | Status: DC | PRN
Start: 2022-08-19 — End: 2022-08-19

## 2022-08-19 MED ORDER — ZOLPIDEM TARTRATE 5 MG PO TABS: 5.0000 mg | ORAL_TABLET | Freq: Every evening | ORAL | Status: DC | PRN

## 2022-08-19 MED ORDER — CEFAZOLIN SODIUM-DEXTROSE 2-4 GM/100ML-% IV SOLN
2.0000 g | Freq: Three times a day (TID) | INTRAVENOUS | Status: AC
  Administered 2022-08-19 – 2022-08-20 (×3): 2 g via INTRAVENOUS
  Filled 2022-08-19 (×3): qty 100

## 2022-08-19 MED ORDER — SUCCINYLCHOLINE CHLORIDE 200 MG/10ML IV SOSY
PREFILLED_SYRINGE | INTRAVENOUS | Status: AC
  Filled 2022-08-19: qty 10

## 2022-08-19 MED ORDER — ONDANSETRON HCL 4 MG/2ML IJ SOLN
4.0000 mg | Freq: Four times a day (QID) | INTRAMUSCULAR | Status: DC | PRN
  Administered 2022-08-19 – 2022-08-20 (×3): 4 mg via INTRAVENOUS
  Filled 2022-08-19 (×3): qty 2

## 2022-08-19 MED ORDER — PROPOFOL 1000 MG/100ML IV EMUL
INTRAVENOUS | Status: AC
  Filled 2022-08-19: qty 100

## 2022-08-19 MED ORDER — DOCUSATE SODIUM 100 MG PO CAPS: 100.0000 mg | ORAL_CAPSULE | Freq: Two times a day (BID) | ORAL | Status: DC

## 2022-08-19 MED ORDER — GABAPENTIN 300 MG PO CAPS
300.0000 mg | ORAL_CAPSULE | Freq: Two times a day (BID) | ORAL | Status: DC
  Administered 2022-08-19 (×2): 300 mg via ORAL
  Filled 2022-08-19 (×3): qty 1

## 2022-08-19 MED ORDER — BISACODYL 10 MG RE SUPP: 10.0000 mg | Freq: Every day | RECTAL | Status: DC | PRN

## 2022-08-19 MED ORDER — ACETAMINOPHEN 325 MG PO TABS: 325.0000 mg | ORAL_TABLET | Freq: Four times a day (QID) | ORAL | Status: DC | PRN

## 2022-08-19 MED ORDER — ENOXAPARIN SODIUM 30 MG/0.3ML IJ SOSY
30.0000 mg | PREFILLED_SYRINGE | INTRAMUSCULAR | Status: DC
  Administered 2022-08-20 – 2022-08-23 (×4): 30 mg via SUBCUTANEOUS
  Filled 2022-08-19 (×3): qty 0.3

## 2022-08-19 MED ORDER — BUPIVACAINE HCL (PF) 0.5 % IJ SOLN
INTRAMUSCULAR | Status: AC
  Filled 2022-08-19: qty 10

## 2022-08-19 MED ORDER — METOCLOPRAMIDE HCL 5 MG PO TABS
5.0000 mg | ORAL_TABLET | Freq: Three times a day (TID) | ORAL | Status: DC | PRN
Start: 2022-08-19 — End: 2022-08-19

## 2022-08-19 MED ORDER — ACETAMINOPHEN 325 MG PO TABS
650.0000 mg | ORAL_TABLET | Freq: Four times a day (QID) | ORAL | Status: DC | PRN
  Administered 2022-08-20 – 2022-08-22 (×6): 650 mg via ORAL
  Filled 2022-08-19 (×6): qty 2

## 2022-08-19 MED ORDER — BISACODYL 10 MG RE SUPP
10.0000 mg | Freq: Every day | RECTAL | Status: DC | PRN
Start: 2022-08-19 — End: 2022-08-19

## 2022-08-19 MED ORDER — LIDOCAINE HCL (CARDIAC) PF 100 MG/5ML IV SOSY
PREFILLED_SYRINGE | INTRAVENOUS | Status: DC | PRN
  Administered 2022-08-19: 60 mg via INTRAVENOUS

## 2022-08-19 MED ORDER — ONDANSETRON HCL 4 MG/2ML IJ SOLN
INTRAMUSCULAR | Status: DC | PRN
  Administered 2022-08-19: 4 mg via INTRAVENOUS

## 2022-08-19 MED ORDER — SUCCINYLCHOLINE CHLORIDE 200 MG/10ML IV SOSY
PREFILLED_SYRINGE | INTRAVENOUS | Status: DC | PRN
  Administered 2022-08-19: 120 mg via INTRAVENOUS

## 2022-08-19 MED ORDER — PROPOFOL 500 MG/50ML IV EMUL
INTRAVENOUS | Status: DC | PRN
  Administered 2022-08-19: 80 ug/kg/min via INTRAVENOUS

## 2022-08-19 MED ORDER — SODIUM CHLORIDE 0.9 % IV SOLN: INTRAVENOUS | Status: DC | PRN

## 2022-08-19 MED ORDER — PHENOL 1.4 % MT LIQD: 1.0000 | OROMUCOSAL | Status: DC | PRN

## 2022-08-19 MED ORDER — FENTANYL CITRATE (PF) 100 MCG/2ML IJ SOLN
INTRAMUSCULAR | Status: AC
  Filled 2022-08-19: qty 2

## 2022-08-19 MED ORDER — MORPHINE SULFATE (PF) 2 MG/ML IV SOLN: 0.5000 mg | INTRAVENOUS | Status: DC | PRN

## 2022-08-19 MED ORDER — MENTHOL 3 MG MT LOZG: 1.0000 | LOZENGE | OROMUCOSAL | Status: DC | PRN

## 2022-08-19 MED ORDER — PHENYLEPHRINE HCL-NACL 20-0.9 MG/250ML-% IV SOLN
INTRAVENOUS | Status: DC | PRN
  Administered 2022-08-19: 25 ug/min via INTRAVENOUS

## 2022-08-19 MED ORDER — METOCLOPRAMIDE HCL 5 MG/ML IJ SOLN
5.0000 mg | Freq: Three times a day (TID) | INTRAMUSCULAR | Status: DC | PRN
Start: 2022-08-19 — End: 2022-08-19

## 2022-08-19 MED ORDER — ALUM & MAG HYDROXIDE-SIMETH 200-200-20 MG/5ML PO SUSP: 30.0000 mL | ORAL | Status: DC | PRN

## 2022-08-19 MED ORDER — SUGAMMADEX SODIUM 200 MG/2ML IV SOLN
INTRAVENOUS | Status: DC | PRN
  Administered 2022-08-19: 200 mg via INTRAVENOUS

## 2022-08-19 MED ORDER — PHENYLEPHRINE HCL-NACL 20-0.9 MG/250ML-% IV SOLN
INTRAVENOUS | Status: AC
  Filled 2022-08-19: qty 250

## 2022-08-19 MED ORDER — HYDROCODONE-ACETAMINOPHEN 5-325 MG PO TABS
1.0000 | ORAL_TABLET | ORAL | Status: DC | PRN
Start: 2022-08-19 — End: 2022-08-19

## 2022-08-19 MED ORDER — METOCLOPRAMIDE HCL 5 MG/ML IJ SOLN: 5.0000 mg | Freq: Three times a day (TID) | INTRAMUSCULAR | Status: DC | PRN

## 2022-08-19 MED ORDER — METHOCARBAMOL 500 MG PO TABS
500.0000 mg | ORAL_TABLET | Freq: Four times a day (QID) | ORAL | Status: DC | PRN
Start: 2022-08-19 — End: 2022-08-19

## 2022-08-19 MED ORDER — FERROUS SULFATE 325 (65 FE) MG PO TABS
325.0000 mg | ORAL_TABLET | Freq: Every day | ORAL | Status: DC
  Administered 2022-08-20: 325 mg via ORAL
  Filled 2022-08-19 (×2): qty 1

## 2022-08-19 MED ORDER — METOCLOPRAMIDE HCL 10 MG PO TABS
5.0000 mg | ORAL_TABLET | Freq: Three times a day (TID) | ORAL | Status: DC | PRN
  Administered 2022-08-19: 10 mg via ORAL
  Filled 2022-08-19 (×2): qty 1

## 2022-08-19 MED ORDER — HYDROCODONE-ACETAMINOPHEN 5-325 MG PO TABS: 1.0000 | ORAL_TABLET | ORAL | Status: DC | PRN

## 2022-08-19 MED ORDER — METHOCARBAMOL 1000 MG/10ML IJ SOLN
500.0000 mg | Freq: Four times a day (QID) | INTRAMUSCULAR | Status: DC | PRN
Start: 2022-08-19 — End: 2022-08-19

## 2022-08-19 MED ORDER — ENOXAPARIN SODIUM 30 MG/0.3ML IJ SOSY: 30.0000 mg | PREFILLED_SYRINGE | INTRAMUSCULAR | Status: DC

## 2022-08-19 MED ORDER — DEXAMETHASONE SODIUM PHOSPHATE 10 MG/ML IJ SOLN
INTRAMUSCULAR | Status: AC
  Filled 2022-08-19: qty 1

## 2022-08-19 MED ORDER — ROCURONIUM BROMIDE 10 MG/ML (PF) SYRINGE
PREFILLED_SYRINGE | INTRAVENOUS | Status: AC
  Filled 2022-08-19: qty 10

## 2022-08-19 MED ORDER — ONDANSETRON HCL 4 MG PO TABS
4.0000 mg | ORAL_TABLET | Freq: Four times a day (QID) | ORAL | Status: DC | PRN
  Filled 2022-08-19: qty 1

## 2022-08-19 MED ORDER — DOCUSATE SODIUM 100 MG PO CAPS
100.0000 mg | ORAL_CAPSULE | Freq: Two times a day (BID) | ORAL | Status: DC
  Administered 2022-08-19 – 2022-08-20 (×3): 100 mg via ORAL
  Filled 2022-08-19 (×3): qty 1

## 2022-08-19 MED ORDER — BUPIVACAINE-EPINEPHRINE 0.25% -1:200000 IJ SOLN
INTRAMUSCULAR | Status: DC | PRN
  Administered 2022-08-19: 30 mL

## 2022-08-19 MED ORDER — CEFAZOLIN SODIUM-DEXTROSE 2-4 GM/100ML-% IV SOLN
INTRAVENOUS | Status: AC
  Filled 2022-08-19: qty 100

## 2022-08-19 MED ORDER — FENTANYL CITRATE (PF) 100 MCG/2ML IJ SOLN
INTRAMUSCULAR | Status: DC | PRN
  Administered 2022-08-19: 25 ug via INTRAVENOUS
  Administered 2022-08-19: 50 ug via INTRAVENOUS
  Administered 2022-08-19: 25 ug via INTRAVENOUS

## 2022-08-19 MED ORDER — CELECOXIB 200 MG PO CAPS
200.0000 mg | ORAL_CAPSULE | Freq: Every day | ORAL | Status: AC
  Administered 2022-08-19: 200 mg via ORAL
  Filled 2022-08-19: qty 1

## 2022-08-19 MED ORDER — ROCURONIUM BROMIDE 100 MG/10ML IV SOLN
INTRAVENOUS | Status: DC | PRN
  Administered 2022-08-19: 40 mg via INTRAVENOUS

## 2022-08-19 MED ORDER — 0.9 % SODIUM CHLORIDE (POUR BTL) OPTIME
TOPICAL | Status: DC | PRN
  Administered 2022-08-19: 500 mL

## 2022-08-19 MED ORDER — CEFAZOLIN SODIUM-DEXTROSE 2-4 GM/100ML-% IV SOLN: 2.0000 g | Freq: Three times a day (TID) | INTRAVENOUS | Status: DC

## 2022-08-19 MED ORDER — ACETAMINOPHEN 325 MG PO TABS
325.0000 mg | ORAL_TABLET | Freq: Four times a day (QID) | ORAL | Status: DC | PRN
Start: 2022-08-20 — End: 2022-08-19

## 2022-08-19 MED ORDER — DEXAMETHASONE SODIUM PHOSPHATE 10 MG/ML IJ SOLN
INTRAMUSCULAR | Status: DC | PRN
  Administered 2022-08-19: 5 mg via INTRAVENOUS

## 2022-08-19 MED ORDER — FLEET ENEMA 7-19 GM/118ML RE ENEM: 1.0000 | ENEMA | Freq: Once | RECTAL | Status: DC | PRN

## 2022-08-19 MED ORDER — SODIUM CHLORIDE 0.45 % IV SOLN: INTRAVENOUS | Status: DC

## 2022-08-19 MED ORDER — FLEET ENEMA 7-19 GM/118ML RE ENEM
1.0000 | ENEMA | Freq: Once | RECTAL | Status: DC | PRN
Start: 2022-08-19 — End: 2022-08-19

## 2022-08-19 MED ORDER — PROPOFOL 10 MG/ML IV BOLUS
INTRAVENOUS | Status: DC | PRN
  Administered 2022-08-19: 80 mg via INTRAVENOUS
  Administered 2022-08-19: 20 mg via INTRAVENOUS

## 2022-08-19 MED ORDER — ALUM & MAG HYDROXIDE-SIMETH 200-200-20 MG/5ML PO SUSP
30.0000 mL | ORAL | Status: DC | PRN
Start: 2022-08-19 — End: 2022-08-19

## 2022-08-19 MED ORDER — METHOCARBAMOL 500 MG PO TABS
500.0000 mg | ORAL_TABLET | Freq: Four times a day (QID) | ORAL | Status: DC | PRN
  Administered 2022-08-20 – 2022-08-21 (×3): 500 mg via ORAL
  Filled 2022-08-19 (×4): qty 1

## 2022-08-19 MED ORDER — MENTHOL 3 MG MT LOZG
1.0000 | LOZENGE | OROMUCOSAL | Status: DC | PRN
  Administered 2022-08-19: 3 mg via ORAL
  Filled 2022-08-19: qty 9

## 2022-08-19 MED ORDER — HYDROMORPHONE HCL 1 MG/ML IJ SOLN: 1.0000 mg | INTRAMUSCULAR | Status: DC | PRN

## 2022-08-19 MED ORDER — ONDANSETRON HCL 4 MG/2ML IJ SOLN
4.0000 mg | Freq: Four times a day (QID) | INTRAMUSCULAR | Status: DC | PRN
Start: 2022-08-19 — End: 2022-08-19

## 2022-08-19 MED ORDER — FENTANYL CITRATE (PF) 100 MCG/2ML IJ SOLN: 25.0000 ug | INTRAMUSCULAR | Status: DC | PRN

## 2022-08-19 MED ORDER — ONDANSETRON HCL 4 MG PO TABS
4.0000 mg | ORAL_TABLET | Freq: Four times a day (QID) | ORAL | Status: DC | PRN
Start: 2022-08-19 — End: 2022-08-19

## 2022-08-19 MED ORDER — ONDANSETRON HCL 4 MG/2ML IJ SOLN
INTRAMUSCULAR | Status: AC
  Filled 2022-08-19: qty 2

## 2022-08-19 MED ORDER — FERROUS SULFATE 325 (65 FE) MG PO TABS: 325.0000 mg | ORAL_TABLET | Freq: Every day | ORAL | Status: DC

## 2022-08-19 SURGICAL SUPPLY — 50 items
APL PRP STRL LF DISP 70% ISPRP (MISCELLANEOUS) ×2
BLADE SAGITTAL WIDE XTHICK NO (BLADE) ×1 IMPLANT
CHLORAPREP W/TINT 26 (MISCELLANEOUS) ×2 IMPLANT
COVER BACK TABLE REUSABLE LG (DRAPES) ×1 IMPLANT
DRAPE INCISE IOBAN 66X60 STRL (DRAPES) ×2 IMPLANT
ELECT CAUTERY BLADE 6.4 (BLADE) ×1 IMPLANT
ELECT REM PT RETURN 9FT ADLT (ELECTROSURGICAL) ×1
ELECTRODE REM PT RTRN 9FT ADLT (ELECTROSURGICAL) ×1 IMPLANT
GAUZE SPONGE 4X4 12PLY STRL (GAUZE/BANDAGES/DRESSINGS) IMPLANT
GAUZE XEROFORM 1X8 LF (GAUZE/BANDAGES/DRESSINGS) ×2 IMPLANT
GLOVE INDICATOR 8.0 STRL GRN (GLOVE) ×1 IMPLANT
GLOVE SURG ORTHO 8.5 STRL (GLOVE) ×1 IMPLANT
GOWN STRL REUS W/ TWL LRG LVL3 (GOWN DISPOSABLE) ×2 IMPLANT
GOWN STRL REUS W/TWL LRG LVL3 (GOWN DISPOSABLE) ×2
GOWN STRL REUS W/TWL LRG LVL4 (GOWN DISPOSABLE) ×1 IMPLANT
HEAD MODULAR ENDO (Orthopedic Implant) ×1 IMPLANT
HEAD UNPLR 48XMDLR STRL HIP (Orthopedic Implant) IMPLANT
HEMOVAC 400CC 10FR (MISCELLANEOUS) ×1 IMPLANT
HOLSTER ELECTROSUGICAL PENCIL (MISCELLANEOUS) ×1 IMPLANT
IV NS 1000ML (IV SOLUTION) ×1
IV NS 1000ML BAXH (IV SOLUTION) ×1 IMPLANT
KIT TURNOVER KIT A (KITS) ×1 IMPLANT
MANIFOLD NEPTUNE II (INSTRUMENTS) ×1 IMPLANT
NDL FILTER BLUNT 18X1 1/2 (NEEDLE) ×1 IMPLANT
NDL SPNL 18GX3.5 QUINCKE PK (NEEDLE) ×2 IMPLANT
NEEDLE FILTER BLUNT 18X1 1/2 (NEEDLE) ×1 IMPLANT
NEEDLE SPNL 18GX3.5 QUINCKE PK (NEEDLE) ×2 IMPLANT
NS IRRIG 1000ML POUR BTL (IV SOLUTION) ×1 IMPLANT
PACK HIP PROSTHESIS (MISCELLANEOUS) ×1 IMPLANT
PAD ABD DERMACEA PRESS 5X9 (GAUZE/BANDAGES/DRESSINGS) IMPLANT
PILLOW ABDUCTION FOAM SM (MISCELLANEOUS) IMPLANT
PULSAVAC PLUS IRRIG FAN TIP (DISPOSABLE) ×1
SLEEVE UNITRAX V40 STD (Orthopedic Implant) IMPLANT
SOL PREP PVP 2OZ (MISCELLANEOUS) ×1
SOLUTION PREP PVP 2OZ (MISCELLANEOUS) ×1 IMPLANT
SPONGE T-LAP 18X18 ~~LOC~~+RFID (SPONGE) ×4 IMPLANT
STAPLER SKIN PROX 35W (STAPLE) ×1 IMPLANT
STEM FEM ACCOLADE 38X102X30 S3 (Stem) IMPLANT
SUT DVC 2 QUILL PDO T11 36X36 (SUTURE) ×1 IMPLANT
SUT QUILL PDO 0 36 36 VIOLET (SUTURE) ×1 IMPLANT
SUT TICRON 2-0 30IN 311381 (SUTURE) ×4 IMPLANT
SYR 10ML LL (SYRINGE) ×1 IMPLANT
SYR 30ML LL (SYRINGE) ×1 IMPLANT
SYR 50ML LL SCALE MARK (SYRINGE) ×1 IMPLANT
TAPE MICROFOAM 4IN (TAPE) ×1 IMPLANT
TIP FAN IRRIG PULSAVAC PLUS (DISPOSABLE) ×1 IMPLANT
TRAP FLUID SMOKE EVACUATOR (MISCELLANEOUS) ×1 IMPLANT
TUBE SUCT KAM VAC (TUBING) ×1 IMPLANT
WATER STERILE IRR 1000ML POUR (IV SOLUTION) ×1 IMPLANT
WATER STERILE IRR 500ML POUR (IV SOLUTION) ×1 IMPLANT

## 2022-08-19 NOTE — Transfer of Care (Signed)
Immediate Anesthesia Transfer of Care Note  Patient: Wanda Baird  Procedure(s) Performed: ARTHROPLASTY BIPOLAR HIP (HEMIARTHROPLASTY) (Left: Hip)  Patient Location: PACU  Anesthesia Type:General  Level of Consciousness: drowsy and patient cooperative  Airway & Oxygen Therapy: Patient Spontanous Breathing and Patient connected to face mask oxygen  Post-op Assessment: Report given to RN and Post -op Vital signs reviewed and stable  Post vital signs: Reviewed and stable  Last Vitals:  Vitals Value Taken Time  BP 133/56 08/19/22 1050  Temp    Pulse 90 08/19/22 1057  Resp 29 08/19/22 1057  SpO2 100 % 08/19/22 1057  Vitals shown include unvalidated device data.  Last Pain:  Vitals:   08/19/22 0229  TempSrc:   PainSc: 0-No pain         Complications: No notable events documented.

## 2022-08-19 NOTE — H&P (Signed)
THE PATIENT WAS SEEN PRIOR TO SURGERY TODAY.  HISTORY, ALLERGIES, HOME MEDICATIONS AND OPERATIVE PROCEDURE WERE REVIEWED. RISKS AND BENEFITS OF SURGERY DISCUSSED WITH PATIENT AGAIN.  NO CHANGES FROM INITIAL HISTORY AND PHYSICAL NOTED.    

## 2022-08-19 NOTE — Anesthesia Procedure Notes (Signed)
Procedure Name: Intubation Date/Time: 08/19/2022 8:49 AM  Performed by: Lynden Oxford, CRNAPre-anesthesia Checklist: Patient identified, Emergency Drugs available, Suction available and Patient being monitored Patient Re-evaluated:Patient Re-evaluated prior to induction Oxygen Delivery Method: Circle system utilized Preoxygenation: Pre-oxygenation with 100% oxygen Induction Type: IV induction Ventilation: Mask ventilation without difficulty Laryngoscope Size: McGraph and 3 Grade View: Grade II Tube type: Oral Number of attempts: 1 Airway Equipment and Method: Stylet and Video-laryngoscopy Placement Confirmation: ETT inserted through vocal cords under direct vision, positive ETCO2 and breath sounds checked- equal and bilateral Secured at: 21 cm Tube secured with: Tape Dental Injury: Teeth and Oropharynx as per pre-operative assessment

## 2022-08-19 NOTE — Anesthesia Preprocedure Evaluation (Addendum)
Anesthesia Evaluation  Patient identified by MRN, date of birth, ID band Patient awake    Reviewed: Allergy & Precautions, NPO status , Patient's Chart, lab work & pertinent test results  Airway Mallampati: III  TM Distance: <3 FB Neck ROM: full    Dental  (+) Chipped, Poor Dentition, Missing, Implants   Pulmonary neg pulmonary ROS, neg shortness of breath   Pulmonary exam normal        Cardiovascular Exercise Tolerance: Good hypertension, (-) angina (-) Past MI Normal cardiovascular exam     Neuro/Psych negative neurological ROS  negative psych ROS   GI/Hepatic negative GI ROS, Neg liver ROS,,,  Endo/Other  diabetes, Type 2Hypothyroidism    Renal/GU      Musculoskeletal   Abdominal   Peds  Hematology negative hematology ROS (+)   Anesthesia Other Findings Past Medical History: No date: Actinic keratosis No date: Diabetes mellitus without complication (HCC) 06/29/2008: Hx of dysplastic nevus     Comment:  R distal lateral thigh, mild to moderate No date: Hypertension 05/07/2016: Squamous cell carcinoma of skin     Comment:  R dorsum foot 12/24/2013: Squamous cell carcinoma of skin     Comment:  SCC IS R dorsal hand 10/10/2020: Squamous cell carcinoma of skin     Comment:  right medial ankle (in situ) - [facility] 11/21/2020 10/10/2020: Squamous cell carcinoma of skin     Comment:  left dorsum 2nd toe (in situ) - [facility] 11/21/2020 11/21/2020: Squamous cell carcinoma of skin     Comment:  L hand dorsum - [facility]  Past Surgical History: No date: BACK SURGERY  BMI    Body Mass Index: 22.66 kg/m      Reproductive/Obstetrics negative OB ROS                             Anesthesia Physical Anesthesia Plan  ASA: 3  Anesthesia Plan: General ETT and Rapid Sequence   Post-op Pain Management:    Induction: Intravenous  PONV Risk Score and Plan: Ondansetron, Dexamethasone, Midazolam and  Treatment may vary due to age or medical condition  Airway Management Planned: Oral ETT  Additional Equipment:   Intra-op Plan:   Post-operative Plan: Extubation in OR  Informed Consent: I have reviewed the patients History and Physical, chart, labs and discussed the procedure including the risks, benefits and alternatives for the proposed anesthesia with the patient or authorized representative who has indicated his/her understanding and acceptance.     Dental Advisory Given  Plan Discussed with: Anesthesiologist, CRNA and Surgeon  Anesthesia Plan Comments: (Patient nauseated so plan for GA  Patient and son consented for risks of anesthesia including but not limited to:  - adverse reactions to medications - damage to eyes, teeth, lips or other oral mucosa - nerve damage due to positioning  - sore throat or hoarseness - Damage to heart, brain, nerves, lungs, other parts of body or loss of life  They voiced understanding.)       Anesthesia Quick Evaluation

## 2022-08-19 NOTE — Progress Notes (Signed)
Progress Note   Patient: Wanda Baird UJW:[id number] DOB: 26-Mar-1927 DOA: 08/18/2022     1 DOS: the patient was seen and examined on 08/19/2022     Subjective:  Patient seen and examined at bedside this morning Patient underwent left hip fracture repair Admits to improvement in hip pain Currently on as needed pain medication Denies nausea vomiting chest pain or abdominal pain   Brief hospital course:  Wanda Baird is a 89 y.o. female with medical history significant of type 2 diabetes, hypertension, hypothyroidism presenting with mechanical fall, left hip fracture.  Patient reports working in her garden when she got up and tripped over a rock landing on her left hip.  Patient reports significant left sided pain after fall.  No reported head trauma loss consciousness.  No reported weakness or dizziness prior to fall.  No chest pain or shortness of breath.  No nausea or vomiting.  No abdominal pain or diarrhea.  Has had significant left hip pain and difficulty with ambulation after the fall.  Noted nondisplaced humerus impaction fracture of the right in April of this year from mechanical fall.  Patient noted currently living on shared properly in a separate house with her son. Presented to the ER afebrile, hemodynamically stable.  Satting well on room air.  White count 9.8, hemoglobin 13, platelets 190.  Creatinine 0.62.  Potassium 2.7.  Chest x-ray stable.  Hip plain films with acute subcapital left femoral neck fracture.  Dr. Hyacinth Meeker with orthopedic surgery consult will plan for operative repair today.   Assessment and Plan:   Displaced subcapital left hip fracture [facility]) Patient is status post left hip hemiarthroplasty done by orthopedic surgeon on 08/19/2022 Have personally reviewed patient's hip x-ray results showing findings of femoral neck fracture Case discussed with orthopedics Patient is status post mechanical fall Dr. Hyacinth Meeker w/ orthopedics has been consulted w/  plan for operative repair today  Continue current pain regiment Patient said she does not do well with hydrocodone and so she does not want hydrocodone ordered for her Continue supportive  care Tylenol ordered for pain management PT OT on board Antiemetics  PT/OT eval     Hyponatremia likely secondary to dehydration Continue supplemental IV fluid Monitor sodium level closely  Hypokalemia-continue potassium replacement Monitor potassium level closely  HLD (hyperlipidemia) Continue Crestor   Mechanical fall Mechanical fall at home with noted acute subcapital left femoral neck fracture  No head trauma or LOC Fall precautions  Continue PT OT   HTN (hypertension) BP stable  Continue home antihypertensives   Type 2 diabetes mellitus (HCC) Continue insulin therapy Monitor glucose level closely   Hypothyroidism Continue Synthroid     Advance Care Planning:   Code Status: Full Code    Consults: Orthopedic surgery w/ Dr. Hyacinth Meeker     Physical Exam:  Appearance: She is normal weight.  HENT:     Head: Normocephalic and atraumatic.     Nose: Nose normal.     Mouth/Throat:     Mouth: Mucous membranes are moist.  Eyes:     Pupils: Pupils are equal, round, and reactive to light.  Cardiovascular:     Rate and Rhythm: Normal rate and regular rhythm.  Pulmonary:     Effort: Pulmonary effort is normal.     Breath sounds: Normal breath sounds.  Abdominal:     General: Bowel sounds are normal.  Musculoskeletal:     Cervical back: Normal range of motion.  Left hip dressing clean and dry Skin:  General: Skin is warm.  Neurological:     General: No focal deficit present.  Psychiatric:        Mood and Affect: Mood normal.    Data Reviewed: I reviewed the patient's lab results showing sodium 129 potassium 3.1 hemoglobin 11.7   Vitals:   08/19/22 1120 08/19/22 1130 08/19/22 1138 08/19/22 1211  BP:  (!) 113/50 (!) 113/50 (!) 96/48  Pulse: (!) 112 100  100  Resp: 20 (!)  23  16  Temp:   99 F (37.2 C) 98.2 F (36.8 C)  TempSrc:    Oral  SpO2: 90% 97%  98%  Weight:      Height:         Author: Loyce Dys, MD 08/19/2022 3:33 PM  For on call review [email or web address].

## 2022-08-19 NOTE — Anesthesia Postprocedure Evaluation (Signed)
Anesthesia Post Note  Patient: Wanda Baird  Procedure(s) Performed: ARTHROPLASTY BIPOLAR HIP (HEMIARTHROPLASTY) (Left: Hip)  Patient location during evaluation: PACU Anesthesia Type: General Level of consciousness: awake and alert Pain management: pain level controlled Vital Signs Assessment: post-procedure vital signs reviewed and stable Respiratory status: spontaneous breathing, nonlabored ventilation, respiratory function stable and patient connected to nasal cannula oxygen Cardiovascular status: blood pressure returned to baseline and stable Postop Assessment: no apparent nausea or vomiting Anesthetic complications: no   No notable events documented.   Last Vitals:  Vitals:   08/19/22 1130 08/19/22 1138  BP: (!) 113/50 (!) 113/50  Pulse: 100   Resp: (!) 23   Temp:  37.2 C  SpO2: 97%     Last Pain:  Vitals:   08/19/22 1050  TempSrc:   PainSc: 0-No pain                 Cleda Mccreedy [name]

## 2022-08-19 NOTE — Op Note (Signed)
08/19/2022  10:45 AM  PATIENT:  Wanda Baird   MRN: [id number]  PRE-OPERATIVE DIAGNOSIS:  Displaced Subcapital fracture left hip   POST-OPERATIVE DIAGNOSIS: Same  PROCEDURE: Left   hip hemiarthroplasty with St[employer]ker Accolade prosthesis  PREOPERATIVE INDICATIONS:  Wanda Baird is an 89 y.o. female who was admitted 08/18/2022 with a diagnosis of displaced subcapital fracture of the hip and elected for surgical management.  The risks benefits and alternatives were discussed with the patient including but not limited to the risks of nonoperative treatment, versus surgical intervention including infection, bleeding, nerve injury, periprosthetic fracture, the need for revision surgery, dislocation, leg length discrepancy, blood clots, cardiopulmonary complications, morbidity, mortality, among others, and they were willing to proceed.  Predicted outcome is good, although there will be at least a six to nine month expected recovery.     SURGEON:  Deeann Saint, MD  ASST:    ANESTHESIA: General endotracheal    COMPLICATIONS:  None.   EBL: 150 cc    COMPONENTS:  Stryker Accolade Femoral Fracture stem size # 3,   and a size   48 mm  fracture head unipolar hip ball with    0 mm  neck length.    PROCEDURE IN DETAIL: The patient was met in the holding area and identified.  The appropriate hip  was marked at the operative site. The patient was then transported to the OR and  placed under general anesthesia.  At that point, the patient was  placed in the lateral decubitus position with the operative side up and  secured to the operating room table and all bony prominences padded.     The operative lower extremity was prepped from the iliac crest to the toes.  Sterile draping was performed.  Time out was performed prior to incision.      A routine posterolateral approach was utilized via sharp dissection  carried down to the subcutaneous tissue.  Gross bleeders were Bovie  coagulated.   The iliotibial band was identified and incised  along the length of the skin incision.  Self-retaining retractors were  inserted.  With the hip internally rotated, the short external rotators  were identified. The piriformis was tagged and the hip capsule released in a T-type fashion.  The femoral neck was exposed, and I resected the femoral neck using the appropriate jig. This was performed at approximately a thumb's breadth above the lesser trochanter.    I then exposed the deep acetabulum, cleared out any tissue including the ligamentum teres.    I then prepared the proximal femur using the cookie-cutter, the lateralizing reamer, and then sequentially broached.  A trial stem   was  utilized along with a unipolar head and neck.  I reduced the hip and it was found to have excellent stability with functional range of motion. Leg lengths were equal.  The trial components were then removed.   The same size Accolade femoral stem was then inserted and was very stable.  The Unitrax head and neck as trialed were inserted as well.     The hip was then reduced and taken through functional range of motion and found to have excellent stability. Leg lengths were restored.     I closed the T in the capsule with #2 Ticron as well as the short external rotators. A hemovac was inserted.    I then irrigated the hip copiously again with pulse lavage, and repaired the fascia with #2 Quill and the subcutaneous layer with #0  Quill. Sponge and needle counts were correct. Dry sterile Aquacell was applied.   The patient was then awakened and returned to PACU in stable and satisfactory condition. There were no complications.  Valinda Hoar, MD Orthopedic Surgeon [phone]   08/19/2022 10:45 AM

## 2022-08-19 NOTE — Progress Notes (Signed)
OT Cancellation Note  Patient Details Name: Wanda Baird MRN: [id number] DOB: 1927/11/20   Cancelled Treatment:    Reason Eval/Treat Not Completed: Other (comment) (Patient having surgery today; will complete OT orders today; please re-order after pt has finished surgery and is stable to participate in OT assessment.)  Alvester Morin 08/19/2022, 8:31 AM

## 2022-08-19 NOTE — Progress Notes (Signed)
PT Cancellation Note  Patient Details Name: Wanda Baird MRN: [id number] DOB: 19-Apr-1927   Cancelled Treatment:    Reason Eval/Treat Not Completed: Patient not medically ready.  PT consult received.  Chart reviewed.  Pt having surgery today.  Will need new PT order post op; current PT order completed due to this.  Please re-consult PT after surgery is finished and pt is medically appropriate to participate in therapy (MD Hyacinth Meeker updated).  Hendricks Limes, PT 08/19/22, 8:33 AM

## 2022-08-20 ENCOUNTER — Encounter: Payer: Self-pay | Admitting: Specialist

## 2022-08-20 DIAGNOSIS — S72009A Fracture of unspecified part of neck of unspecified femur, initial encounter for closed fracture: Secondary | ICD-10-CM

## 2022-08-20 LAB — GLUCOSE, CAPILLARY
Glucose-Capillary: 119 mg/dL — ABNORMAL HIGH (ref 70–99)
Glucose-Capillary: 120 mg/dL — ABNORMAL HIGH (ref 70–99)
Glucose-Capillary: 225 mg/dL — ABNORMAL HIGH (ref 70–99)
Glucose-Capillary: 174 mg/dL — ABNORMAL HIGH (ref 70–99)
Glucose-Capillary: 128 mg/dL — ABNORMAL HIGH (ref 70–99)

## 2022-08-20 LAB — BASIC METABOLIC PANEL
Anion gap: 7 (ref 5–15)
BUN: 12 mg/dL (ref 8–23)
CO2: 24 mmol/L (ref 22–32)
Calcium: 8.3 mg/dL — ABNORMAL LOW (ref 8.9–10.3)
Chloride: 107 mmol/L (ref 98–111)
Creatinine, Ser: 0.51 mg/dL (ref 0.44–1.00)
GFR, Estimated: >60 mL/min (ref >60)
Glucose, Bld: 111 mg/dL — ABNORMAL HIGH (ref 70–99)
Potassium: 3.2 mmol/L — ABNORMAL LOW (ref 3.5–5.1)
Sodium: 138 mmol/L (ref 135–145)

## 2022-08-20 LAB — CBC
HCT: 29.4 % — ABNORMAL LOW (ref 36.0–46.0)
Hemoglobin: 9.8 g/dL — ABNORMAL LOW (ref 12.0–15.0)
MCH: 30.5 pg (ref 26.0–34.0)
MCHC: 33.3 g/dL (ref 30.0–36.0)
MCV: 91.6 fL (ref 80.0–100.0)
Platelets: 144 10*3/uL — ABNORMAL LOW (ref 150–400)
RBC: 3.21 MIL/uL — ABNORMAL LOW (ref 3.87–5.11)
RDW: 12.7 % (ref 11.5–15.5)
WBC: 8.3 10*3/uL (ref 4.0–10.5)
nRBC: 0 % (ref 0.0–0.2)

## 2022-08-20 MED ORDER — POTASSIUM CHLORIDE CRYS ER 20 MEQ PO TBCR
40.0000 meq | EXTENDED_RELEASE_TABLET | ORAL | Status: AC
  Administered 2022-08-20 (×2): 40 meq via ORAL
  Filled 2022-08-20 (×2): qty 2

## 2022-08-20 NOTE — Progress Notes (Signed)
Subjective: 1 Day Post-Op Procedure(s) (LRB): ARTHROPLASTY BIPOLAR HIP (HEMIARTHROPLASTY) (Left) Patient is doing very well.  She is alert and oriented in her bed.  She says she already walked in the hall.  Pain is minimal.  Dressing is dry.  Hemoglobin is 9.8.  Potassium is up to 3.2.  I explained there is a necessity for her to go for her to go to skilled nursing since she does live alone.  Patient reports pain as mild.  Objective:   VITALS:   Vitals:   08/20/22 0402 08/20/22 0854  BP: (!) 122/51 133/65  Pulse: 88 (!) 105  Resp: 20   Temp: 98.5 F (36.9 C)   SpO2: 91% 95%    Neurologically intact Incision: dressing C/D/I  LABS Recent Labs    08/18/22 1053 08/19/22 0419 08/20/22 0559  HGB 13.0 11.7* 9.8*  HCT 37.4 35.1* 29.4*  WBC 9.8 10.0 8.3  PLT 188 157 144*    Recent Labs    08/18/22 1847 08/19/22 0419 08/20/22 0559  NA 130* 129* 138  K 3.2* 3.1* 3.2*  BUN 12 10 12   CREATININE 0.43* 0.47 0.51  GLUCOSE 127* 152* 111*    Recent Labs    08/18/22 1053  INR 1.0     Assessment/Plan: 1 Day Post-Op Procedure(s) (LRB): ARTHROPLASTY BIPOLAR HIP (HEMIARTHROPLASTY) (Left)   Advance diet Up with therapy Discharge to SNF when medically stable. Will discharge on 81 mg ASA twice daily for 6 weeks She will follow-up in my office in 2 weeks.

## 2022-08-20 NOTE — Plan of Care (Signed)

## 2022-08-20 NOTE — Progress Notes (Signed)
Progress Note   Patient: Wanda Baird GEX:[id number] DOB: 1927-05-20 DOA: 08/18/2022     2 DOS: the patient was seen and examined on 08/20/2022     Subjective:  Patient seen and examined at bedside this morning Denies any worsening hip pain Patient has worked with PT OT and have recommended skilled nursing facility/short-term rehab Patient has been cleared by orthopedic surgeon today for discharge and follow-up in the clinic     Brief hospital course:   Wanda Baird is a 89 y.o. female with medical history significant of type 2 diabetes, hypertension, hypothyroidism presenting with mechanical fall, left hip fracture.  Patient reports working in her garden when she got up and tripped over a rock landing on her left hip.  Patient reports significant left sided pain after fall.  No reported head trauma loss consciousness.  No reported weakness or dizziness prior to fall.  No chest pain or shortness of breath.  No nausea or vomiting.  No abdominal pain or diarrhea.  Has had significant left hip pain and difficulty with ambulation after the fall.  Noted nondisplaced humerus impaction fracture of the right in April of this year from mechanical fall.  Patient noted currently living on shared properly in a separate house with her son. Presented to the ER afebrile, hemodynamically stable.  Satting well on room air.  White count 9.8, hemoglobin 13, platelets 190.  Creatinine 0.62.  Potassium 2.7.  Chest x-ray stable.  Hip plain films with acute subcapital left femoral neck fracture.  Dr. Hyacinth Meeker with orthopedic surgery consult will plan for operative repair today.     Assessment and Plan:     Displaced subcapital left hip fracture [facility]) Patient is status post left hip hemiarthroplasty done by orthopedic surgeon on 08/19/2022 Her hip x-ray results showing findings of femoral neck fracture Case discussed with orthopedics Patient said she does not do well with hydrocodone and so she  does not want hydrocodone ordered for her We will continue supportive care Patient has worked with PT OT and have recommended skilled nursing facility/short-term rehab Patient has been cleared by orthopedic surgeon today for discharge and follow-up in the clinic Transition of care following for placement needs     Hyponatremia likely secondary to dehydration Monitor sodium levels closely   Hypokalemia-continue potassium replacement Monitor potassium level closely   HLD (hyperlipidemia) Continue Crestor   Mechanical fall Mechanical fall at home with noted acute subcapital left femoral neck fracture  No head trauma or LOC Fall precautions  Continue PT OT   HTN (hypertension) BP stable  Continue home antihypertensives   Type 2 diabetes mellitus (HCC) Continue insulin therapy Monitor glucose level closely   Hypothyroidism Continue Synthroid     Advance Care Planning:   Code Status: Full Code    Consults: Orthopedic surgery w/ Dr. Hyacinth Meeker      Physical Exam:  Appearance: She is normal weight.  HENT:     Head: Normocephalic and atraumatic.     Nose: Nose normal.     Mouth/Throat:     Mouth: Mucous membranes are moist.  Eyes:     Pupils: Pupils are equal, round, and reactive to light.  Cardiovascular:     Rate and Rhythm: Normal rate and regular rhythm.  Pulmonary:     Effort: Pulmonary effort is normal.     Breath sounds: Normal breath sounds.  Abdominal:     General: Bowel sounds are normal.  Musculoskeletal:     Cervical back: Normal range of  motion.  Left hip dressing clean and dry Skin:    General: Skin is warm.  Neurological:     General: No focal deficit present.  Psychiatric:        Mood and Affect: Mood normal.      Data Reviewed: I reviewed patient's labs today      Vitals:   08/19/22 2350 08/20/22 0402 08/20/22 0854 08/20/22 1539  BP: (!) 116/49 (!) 122/51 133/65 (!) 150/57  Pulse: 81 88 (!) 105 99  Resp: 16 20  19   Temp: (!) 97.5 F (36.4  C) 98.5 F (36.9 C)  99 F (37.2 C)  TempSrc:      SpO2: 92% 91% 95% 99%  Weight:      Height:         Author: Loyce Dys, MD 08/20/2022 6:24 PM  For on call review [email or web address].

## 2022-08-20 NOTE — Plan of Care (Signed)
  Problem: Education: Goal: Knowledge of General Education information will improve Description Including pain rating scale, medication(s)/side effects and non-pharmacologic comfort measures Outcome: Progressing   Problem: Health Behavior/Discharge Planning: Goal: Ability to manage health-related needs will improve Outcome: Progressing   Problem: Clinical Measurements: Goal: Ability to maintain clinical measurements within normal limits will improve Outcome: Progressing Goal: Will remain free from infection Outcome: Progressing Goal: Diagnostic test results will improve Outcome: Progressing Goal: Respiratory complications will improve Outcome: Progressing Goal: Cardiovascular complication will be avoided Outcome: Progressing   Problem: Activity: Goal: Risk for activity intolerance will decrease Outcome: Progressing   Problem: Nutrition: Goal: Adequate nutrition will be maintained Outcome: Progressing   Problem: Coping: Goal: Level of anxiety will decrease Outcome: Progressing   Problem: Elimination: Goal: Will not experience complications related to bowel motility Outcome: Progressing Goal: Will not experience complications related to urinary retention Outcome: Progressing   Problem: Pain Managment: Goal: General experience of comfort will improve Outcome: Progressing   Problem: Safety: Goal: Ability to remain free from injury will improve Outcome: Progressing   Problem: Skin Integrity: Goal: Risk for impaired skin integrity will decrease Outcome: Progressing   Problem: Education: Goal: Verbalization of understanding the information provided (i.e., activity precautions, restrictions, etc) will improve Outcome: Progressing   Problem: Activity: Goal: Ability to ambulate and perform ADLs will improve Outcome: Progressing   Problem: Clinical Measurements: Goal: Postoperative complications will be avoided or minimized Outcome: Progressing   Problem:  Self-Concept: Goal: Ability to maintain and perform role responsibilities to the fullest extent possible will improve Outcome: Progressing   Problem: Pain Management: Goal: Pain level will decrease Outcome: Progressing   

## 2022-08-20 NOTE — Evaluation (Addendum)
Physical Therapy Evaluation Patient Details Name: Wanda Baird MRN: [id number] DOB: October 25, 1927 Today's Date: 08/20/2022  History of Present Illness  Wanda Baird is a 89 y.o. female presenting after a mechanical fall, imaging shows left hip fracture, s/p L hemiarthroplasty 6/23. PMH significant for HTN, DM, hypothyroidism  Clinical Impression  Pt is EOB with OT, pt denies any pain. PTA pt was modI with mobility, using a RW as needed, independent with ADL's, lives alone but son lives on same property. Pt able to perform transfers c/ minA for lift, ambulation c/ CGA and cueing to maintain WB precautions. Pt was able to ambulate ~52ft but PT deferred further mobility due to pt inability to maintain PWB 50% with max VC. Pt able to recall 2/3 hip precautions at end of session. Pt left in chair with alarm set, needs within reach. Pt would benefit from acute therapy services to improve mobility, functional activity tolerance, and reinforce safety precautions.      Recommendations for follow up therapy are one component of a multi-disciplinary discharge planning process, led by the attending physician.  Recommendations may be updated based on patient status, additional functional criteria and insurance authorization.  Follow Up Recommendations Can patient physically be transported by private vehicle: Yes     Assistance Recommended at Discharge Frequent or constant Supervision/Assistance  Patient can return home with the following  A lot of help with walking and/or transfers;A lot of help with bathing/dressing/bathroom;Assistance with cooking/housework;Assist for transportation;Help with stairs or ramp for entrance;Direct supervision/assist for medications management    Equipment Recommendations Other (comment) (TBD at next venue of care)  Recommendations for Other Services       Functional Status Assessment Patient has had a recent decline in their functional status and  demonstrates the ability to make significant improvements in function in a reasonable and predictable amount of time.     Precautions / Restrictions Precautions Precautions: Posterior Hip;Fall Restrictions Weight Bearing Restrictions: Yes LLE Weight Bearing: Partial weight bearing LLE Partial Weight Bearing Percentage or Pounds: 50%      Mobility  Bed Mobility                    Transfers Overall transfer level: Needs assistance Equipment used: Rolling walker (2 wheels) Transfers: Sit to/from Stand Sit to Stand: Min assist           General transfer comment: minA for sit<>stand, pt hesitant to bear weight thru RUE due to recent fx    Ambulation/Gait Ambulation/Gait assistance: Min guard Gait Distance (Feet): 60 Feet Assistive device: Rolling walker (2 wheels) Gait Pattern/deviations: Step-to pattern, Decreased stride length, Decreased weight shift to left, Decreased step length - left, Decreased stance time - left       General Gait Details: pt unable to maintain WB precautions with repeated cueing, deferred further ambulation  Stairs            Wheelchair Mobility    Modified Rankin (Stroke Patients Only)       Balance Overall balance assessment: Needs assistance Sitting-balance support: No upper extremity supported, Feet supported Sitting balance-Leahy Scale: Good     Standing balance support: Bilateral upper extremity supported Standing balance-Leahy Scale: Fair                               Pertinent Vitals/Pain Pain Assessment Pain Assessment: No/denies pain    Home Living Family/patient expects to be discharged to:: Private residence  Living Arrangements: Alone;Children (son lives on same property) Available Help at Discharge: Family;Available PRN/intermittently Type of Home: House Home Access: Stairs to enter Entrance Stairs-Rails: Lawyer of Steps: 3   Home Layout: One level Home  Equipment: Grab bars - tub/shower;Shower Counsellor (2 wheels)      Prior Function Prior Level of Function : Independent/Modified Independent             Mobility Comments: uses RW PRN ADLs Comments: Pt lives alone, but on same property as son who is available 24/7.     Hand Dominance        Extremity/Trunk Assessment   Upper Extremity Assessment Upper Extremity Assessment: Generalized weakness    Lower Extremity Assessment Lower Extremity Assessment: Generalized weakness;LLE deficits/detail LLE Deficits / Details: s/p L hemiarthroplasty LLE Sensation: WNL LLE Coordination: WNL       Communication   Communication: No difficulties  Cognition Arousal/Alertness: Awake/alert Behavior During Therapy: WFL for tasks assessed/performed Overall Cognitive Status: Within Functional Limits for tasks assessed                                 General Comments: Pt unaware of hip precautions prior to session. Education completed, but reinforcement may be needed.        General Comments      Exercises Total Joint Exercises Ankle Circles/Pumps: AROM, 10 reps, Supine Quad Sets: AROM, 10 reps, Supine   Assessment/Plan    PT Assessment Patient needs continued PT services  PT Problem List Decreased strength;Decreased range of motion;Decreased knowledge of use of DME;Decreased activity tolerance;Decreased safety awareness;Decreased balance;Decreased knowledge of precautions;Decreased mobility;Decreased coordination;Pain       PT Treatment Interventions      PT Goals (Current goals can be found in the Care Plan section)  Acute Rehab PT Goals Patient Stated Goal: to return to home PT Goal Formulation: With patient Time For Goal Achievement: 09/03/22 Potential to Achieve Goals: Good    Frequency 7X/week     Co-evaluation PT/OT/SLP Co-Evaluation/Treatment: Yes Reason for Co-Treatment: For patient/therapist safety PT goals addressed during session:  Mobility/safety with mobility;Proper use of DME OT goals addressed during session: ADL's and self-care;Proper use of Adaptive equipment and DME       AM-PAC PT "6 Clicks" Mobility  Outcome Measure Help needed turning from your back to your side while in a flat bed without using bedrails?: A Little Help needed moving from lying on your back to sitting on the side of a flat bed without using bedrails?: A Little Help needed moving to and from a bed to a chair (including a wheelchair)?: A Little Help needed standing up from a chair using your arms (e.g., wheelchair or bedside chair)?: A Little Help needed to walk in hospital room?: A Little Help needed climbing 3-5 steps with a railing? : A Lot 6 Click Score: 17    End of Session Equipment Utilized During Treatment: Gait belt Activity Tolerance: Patient tolerated treatment well Patient left: in chair;with chair alarm set;with call bell/phone within reach Nurse Communication: Mobility status PT Visit Diagnosis: History of falling (Z91.81);Muscle weakness (generalized) (M62.81);Other abnormalities of gait and mobility (R26.89)    Time: 8413-2440 PT Time Calculation (min) (ACUTE ONLY): 20 min   Charges:   PT Evaluation $PT Eval Low Complexity: 1 Low PT Treatments $Therapeutic Activity: 8-22 mins        Lala Lund, PT, SPT  12:57 PM,08/20/22

## 2022-08-20 NOTE — Evaluation (Signed)
Occupational Therapy Evaluation Patient Details Name: Rand Boller MRN: [id number] DOB: 1928-02-10 Today's Date: 08/20/2022   History of Present Illness Merin Borjon is a 89 y.o. female with medical history significant of type 2 diabetes, hypertension, hypothyroidism presenting with mechanical fall, left hip fracture. Patient reports working in her garden when she got up and tripped over a rock landing on her left hip.   Clinical Impression   Pt was seen for OT evaluation this date. Prior to hospital admission, pt was independent with ADLs. Pt lives in a single story home with three steps to enter. Pt's son lives on the same property and is available to assist as needed. Pt presents to acute OT demonstrating impaired ADL performance and functional mobility 2/2 decreased ROM, activity tolerance, and balance (See OT problem list for additional functional deficits). Pt currently requires MIN A for transfers with RW. Pt provided education and coaching on posterior hip precautions. Pt reported no/minimal pain at this time. Pt interested in returning home, but is concerned about performing lower body related ADLs. Pt would benefit from skilled OT services to address noted impairments and functional limitations (see below for any additional details) in order to maximize safety and independence while minimizing falls risk and caregiver burden. Anticipate the need for follow up OT services upon acute hospital DC.      Recommendations for follow up therapy are one component of a multi-disciplinary discharge planning process, led by the attending physician.  Recommendations may be updated based on patient status, additional functional criteria and insurance authorization.   Assistance Recommended at Discharge Intermittent Supervision/Assistance  Patient can return home with the following A little help with walking and/or transfers;A little help with bathing/dressing/bathroom;Assistance with  cooking/housework;Assist for transportation;Help with stairs or ramp for entrance    Functional Status Assessment  Patient has had a recent decline in their functional status and demonstrates the ability to make significant improvements in function in a reasonable and predictable amount of time.  Equipment Recommendations  Other (comment) (defer to next venue of care)    Recommendations for Other Services       Precautions / Restrictions Precautions Precautions: Posterior Hip Restrictions Weight Bearing Restrictions: Yes LLE Weight Bearing: Partial weight bearing LLE Partial Weight Bearing Percentage or Pounds: 50%      Mobility Bed Mobility                    Transfers Overall transfer level: Needs assistance Equipment used: Rolling walker (2 wheels) Transfers: Bed to chair/wheelchair/BSC       Step pivot transfers: Min assist     General transfer comment: MIN A to maintain precautions and balance upon standing. Pt hesitant to weight bear through RUE while using RW due to recovering fx.      Balance Overall balance assessment: Needs assistance Sitting-balance support: No upper extremity supported, Feet supported Sitting balance-Leahy Scale: Good     Standing balance support: Bilateral upper extremity supported Standing balance-Leahy Scale: Fair                             ADL either performed or assessed with clinical judgement   ADL Overall ADL's : Needs assistance/impaired                         Toilet Transfer: Minimal assistance;Cueing for safety;Stand-pivot;BSC/3in1;Rolling walker (2 wheels)   Toileting- Clothing Manipulation and Hygiene: Supervision/safety;Sitting/lateral lean  Functional mobility during ADLs: Rolling walker (2 wheels);Minimal assistance General ADL Comments: Anticipate MOD A for lower body ADLs. MIN A for sit<>stand with walker, cueing to maintain hip precautions.     Vision          Perception     Praxis      Pertinent Vitals/Pain Pain Assessment Pain Assessment: No/denies pain     Hand Dominance     Extremity/Trunk Assessment Upper Extremity Assessment Upper Extremity Assessment: Overall WFL for tasks assessed   Lower Extremity Assessment Lower Extremity Assessment: Defer to PT evaluation       Communication Communication Communication: No difficulties   Cognition Arousal/Alertness: Awake/alert Behavior During Therapy: WFL for tasks assessed/performed Overall Cognitive Status: Within Functional Limits for tasks assessed                                 General Comments: Pt unaware of hip precautions prior to session. Education completed, but reinforcement may be needed.     General Comments       Exercises     Shoulder Instructions      Home Living Family/patient expects to be discharged to:: Private residence Living Arrangements: Alone;Children (son lives on same property) Available Help at Discharge: Family;Available PRN/intermittently Type of Home: House Home Access: Stairs to enter [employer] of Steps: 3 Entrance Stairs-Rails: Left;Right           Bathroom Toilet: Handicapped height     Home Equipment: Grab bars - tub/shower;Shower seat          Prior Functioning/Environment Prior Level of Function : Independent/Modified Independent               ADLs Comments: Pt lives alone, but on same property as son who is available 24/7.        OT Problem List: Decreased range of motion;Decreased activity tolerance;Impaired balance (sitting and/or standing);Decreased safety awareness;Decreased knowledge of use of DME or AE      OT Treatment/Interventions: Self-care/ADL training;Therapeutic exercise;Energy conservation;DME and/or AE instruction;Therapeutic activities;Patient/family education    OT Goals(Current goals can be found in the care plan section) Acute Rehab OT Goals Patient Stated Goal:  To go home OT Goal Formulation: With patient Time For Goal Achievement: 09/03/22 Potential to Achieve Goals: Good ADL Goals Pt Will Perform Grooming: with modified independence;standing Pt Will Perform Lower Body Dressing: with min assist;with caregiver independent in assisting;sit to/from stand Pt Will Transfer to Toilet: with min guard assist;ambulating  OT Frequency: Min 1X/week    Co-evaluation PT/OT/SLP Co-Evaluation/Treatment: Yes     OT goals addressed during session: ADL's and self-care;Proper use of Adaptive equipment and DME      AM-PAC OT "6 Clicks" Daily Activity     Outcome Measure Help from another person eating meals?: None Help from another person taking care of personal grooming?: A Little Help from another person toileting, which includes using toliet, bedpan, or urinal?: A Little Help from another person bathing (including washing, rinsing, drying)?: A Lot Help from another person to put on and taking off regular upper body clothing?: A Little Help from another person to put on and taking off regular lower body clothing?: A Lot 6 Click Score: 17   End of Session Equipment Utilized During Treatment: Gait belt;Rolling walker (2 wheels)  Activity Tolerance: Patient tolerated treatment well Patient left: Other (comment) (Hand off with PT)  OT Visit Diagnosis: Unsteadiness on feet (R26.81);History of falling (Z91.81);Muscle  weakness (generalized) (M62.81)                Time: 9528-4132 OT Time Calculation (min): 19 min Charges:  OT General Charges $OT Visit: 1 Visit OT Evaluation $OT Eval Moderate Complexity: 1 Mod [address] OTS

## 2022-08-21 DIAGNOSIS — S72009A Fracture of unspecified part of neck of unspecified femur, initial encounter for closed fracture: Secondary | ICD-10-CM | POA: Diagnosis not present

## 2022-08-21 LAB — GLUCOSE, CAPILLARY
Glucose-Capillary: 160 mg/dL — ABNORMAL HIGH (ref 70–99)
Glucose-Capillary: 167 mg/dL — ABNORMAL HIGH (ref 70–99)
Glucose-Capillary: 148 mg/dL — ABNORMAL HIGH (ref 70–99)
Glucose-Capillary: 192 mg/dL — ABNORMAL HIGH (ref 70–99)
Glucose-Capillary: 206 mg/dL — ABNORMAL HIGH (ref 70–99)
Glucose-Capillary: 110 mg/dL — ABNORMAL HIGH (ref 70–99)

## 2022-08-21 LAB — CBC
HCT: 27.5 % — ABNORMAL LOW (ref 36.0–46.0)
Hemoglobin: 9.1 g/dL — ABNORMAL LOW (ref 12.0–15.0)
MCH: 30.7 pg (ref 26.0–34.0)
MCHC: 33.1 g/dL (ref 30.0–36.0)
MCV: 92.9 fL (ref 80.0–100.0)
Platelets: 126 10*3/uL — ABNORMAL LOW (ref 150–400)
RBC: 2.96 MIL/uL — ABNORMAL LOW (ref 3.87–5.11)
RDW: 13.1 % (ref 11.5–15.5)
WBC: 6.1 10*3/uL (ref 4.0–10.5)
nRBC: 0 % (ref 0.0–0.2)

## 2022-08-21 LAB — BASIC METABOLIC PANEL
Anion gap: 7 (ref 5–15)
BUN: 11 mg/dL (ref 8–23)
CO2: 21 mmol/L — ABNORMAL LOW (ref 22–32)
Calcium: 7.9 mg/dL — ABNORMAL LOW (ref 8.9–10.3)
Chloride: 107 mmol/L (ref 98–111)
Creatinine, Ser: 0.46 mg/dL (ref 0.44–1.00)
GFR, Estimated: >60 mL/min (ref >60)
Glucose, Bld: 178 mg/dL — ABNORMAL HIGH (ref 70–99)
Potassium: 4.1 mmol/L (ref 3.5–5.1)
Sodium: 135 mmol/L (ref 135–145)

## 2022-08-21 MED ORDER — HYDROCHLOROTHIAZIDE 25 MG PO TABS: 25.0000 mg | ORAL_TABLET | Freq: Every day | ORAL | Status: DC

## 2022-08-21 MED ORDER — SODIUM CHLORIDE 0.9 % IV SOLN: 12.5000 mg | Freq: Once | INTRAVENOUS | Status: DC

## 2022-08-21 MED ORDER — IRBESARTAN 150 MG PO TABS
300.0000 mg | ORAL_TABLET | Freq: Every day | ORAL | Status: DC
  Administered 2022-08-22 – 2022-08-24 (×3): 300 mg via ORAL
  Filled 2022-08-21 (×3): qty 2

## 2022-08-21 MED ORDER — VALSARTAN-HYDROCHLOROTHIAZIDE 320-25 MG PO TABS: 1.0000 | ORAL_TABLET | Freq: Every day | ORAL | Status: DC

## 2022-08-21 MED ORDER — ROSUVASTATIN CALCIUM 5 MG PO TABS
5.0000 mg | ORAL_TABLET | ORAL | Status: DC
  Administered 2022-08-22 – 2022-08-24 (×2): 5 mg via ORAL
  Filled 2022-08-21 (×3): qty 1

## 2022-08-21 MED ORDER — LEVOTHYROXINE SODIUM 88 MCG PO TABS
88.0000 ug | ORAL_TABLET | Freq: Every day | ORAL | Status: DC
  Administered 2022-08-22 – 2022-08-24 (×3): 88 ug via ORAL
  Filled 2022-08-21 (×3): qty 1

## 2022-08-21 NOTE — Progress Notes (Signed)
Patient c/o of nausea, PRN nausea meds given ineffective.

## 2022-08-21 NOTE — Progress Notes (Signed)
Occupational Therapy Treatment Patient Details Name: Wanda Baird MRN: [id number] DOB: 14-Apr-1927 Today's Date: 08/21/2022   History of present illness Wanda Baird is a 89 y.o. female presenting after a mechanical fall, imaging shows left hip fracture, s/p L hemiarthroplasty 6/23. PMH significant for HTN, DM, hypothyroidism   OT comments  [name] seen for OT treatment on this date. Upon arrival to room pt was seated in recliner, agreeable to tx. Pt reported pain of 2/10, but the chief complaint was nausea. Pt agreeable to seated UE and breathing exercises. Pt was able to perform guided UE exercises without increase of symptoms. Pt recalled 3/3 posterior hip precautions with minimal verbal prompting and received further reinforcement. Pt educated on incentive spirometer and demonstrated appropriate use of the device. Pt educated on adaptive dressing while maintaining precautions. Pt making progress toward goals, will continue to follow POC. Discharge recommendation remains appropriate.     Recommendations for follow up therapy are one component of a multi-disciplinary discharge planning process, led by the attending physician.  Recommendations may be updated based on patient status, additional functional criteria and insurance authorization.    Assistance Recommended at Discharge Intermittent Supervision/Assistance  Patient can return home with the following  A little help with walking and/or transfers;A little help with bathing/dressing/bathroom;Assistance with cooking/housework;Assist for transportation;Help with stairs or ramp for entrance   Equipment Recommendations  Other (comment) (defer to next venue of care)    Recommendations for Other Services      Precautions / Restrictions Precautions Precautions: Posterior Hip;Fall Restrictions Weight Bearing Restrictions: Yes LLE Weight Bearing: Partial weight bearing LLE Partial Weight Bearing Percentage or Pounds: 50%        Mobility Bed Mobility                    Transfers                         Balance                                           ADL either performed or assessed with clinical judgement   ADL                                              Extremity/Trunk Assessment Upper Extremity Assessment Upper Extremity Assessment: Overall WFL for tasks assessed   Lower Extremity Assessment Lower Extremity Assessment: Defer to PT evaluation        Vision       Perception     Praxis      Cognition Arousal/Alertness: Awake/alert Behavior During Therapy: WFL for tasks assessed/performed Overall Cognitive Status: Within Functional Limits for tasks assessed                                 General Comments: Pt able to recall hip precautions with minimal verbal cueing.        Exercises Exercises: Other exercises General Exercises - Upper Extremity Elbow Flexion: AROM, Both, 10 reps, Seated Elbow Extension: AROM, Both, 10 reps, Seated Wrist Flexion: AROM, Both, 10 reps, Seated Wrist Extension: AROM, Both, 10 reps, Seated Digit Composite Flexion: AROM, Both, 10 reps,  Seated    Shoulder Instructions       General Comments      Pertinent Vitals/ Pain       Pain Assessment Pain Assessment: 0-10 Pain Score: 2  Pain Location: posterior hip Pain Descriptors / Indicators: Discomfort Pain Intervention(s): RN gave pain meds during session  Home Living                                          Prior Functioning/Environment              Frequency  Min 1X/week        Progress Toward Goals  OT Goals(current goals can now be found in the care plan section)  Progress towards OT goals: Progressing toward goals  Acute Rehab OT Goals Patient Stated Goal: To feel better OT Goal Formulation: With patient Time For Goal Achievement: 09/03/22 Potential to Achieve Goals: Good ADL  Goals Pt Will Perform Grooming: with modified independence;standing Pt Will Perform Lower Body Dressing: with min assist;with caregiver independent in assisting;sit to/from stand Pt Will Transfer to Toilet: with min guard assist;ambulating  Plan Discharge plan remains appropriate    Co-evaluation                 AM-PAC OT "6 Clicks" Daily Activity     Outcome Measure   Help from another person eating meals?: None Help from another person taking care of personal grooming?: A Little Help from another person toileting, which includes using toliet, bedpan, or urinal?: A Little Help from another person bathing (including washing, rinsing, drying)?: A Lot Help from another person to put on and taking off regular upper body clothing?: A Little Help from another person to put on and taking off regular lower body clothing?: A Lot 6 Click Score: 17    End of Session    OT Visit Diagnosis: Unsteadiness on feet (R26.81);History of falling (Z91.81);Muscle weakness (generalized) (M62.81)   Activity Tolerance Patient tolerated treatment well   Patient Left in chair;with call bell/phone within reach;with chair alarm set   Nurse Communication          Time: [phone] OT Time Calculation (min): 26 min  Charges: OT General Charges $OT Visit: 1 Visit OT Treatments $Self Care/Home Management : 8-22 mins $Therapeutic Exercise: 8-22 mins  Thresa Ross, OTS

## 2022-08-21 NOTE — Progress Notes (Addendum)
Progress Note   Patient: Wanda Baird WUJ:[id number] DOB: 1927/05/01 DOA: 08/18/2022     3 DOS: the patient was seen and examined on 08/21/2022     Subjective:  Patient seen and examined this morning Did complain of some nausea but no vomiting Denies any hip pain   Brief hospital course:   From HPI "Wanda Baird is a 89 y.o. female with medical history significant of type 2 diabetes, hypertension, hypothyroidism presenting with mechanical fall, left hip fracture.  Patient reports working in her garden when she got up and tripped over a rock landing on her left hip.  Patient reports significant left sided pain after fall.  No reported head trauma loss consciousness.  No reported weakness or dizziness prior to fall.  No chest pain or shortness of breath.  No nausea or vomiting.  No abdominal pain or diarrhea.  Has had significant left hip pain and difficulty with ambulation after the fall.  Noted nondisplaced humerus impaction fracture of the right in April of this year from mechanical fall.  Patient noted currently living on shared properly in a separate house with her son. Presented to the ER afebrile, hemodynamically stable.  Satting well on room air.  White count 9.8, hemoglobin 13, platelets 190.  Creatinine 0.62.  Potassium 2.7.  Chest x-ray stable.  Hip plain films with acute subcapital left femoral neck fracture.  Dr. Hyacinth Meeker with orthopedic surgery consult will plan for operative repair today."   Assessment and Plan:     Displaced subcapital left hip fracture [facility]) Patient is status post left hip hemiarthroplasty done by orthopedic surgeon on 08/19/2022 On presentation hip x-ray showed femoral neck fracture Case discussed with orthopedics Patient said she does not do well with hydrocodone and so she does not want hydrocodone ordered for her. She only wants tylenol for pain management. We will continue supportive care Patient has worked with PT OT and have recommended  skilled nursing facility/short-term rehab Patient has been cleared by orthopedic surgeon for discharge and follow-up in the clinic Patient would need aspirin 81 mg twice daily for 6 weeks as recommended by orthopedics Transition of care following for placement needs     Hyponatremia likely secondary to dehydration Monitor sodium levels closely   Hypokalemia-continue potassium replacement Monitor potassium level closely   HLD (hyperlipidemia) Continue Crestor   Mechanical fall Mechanical fall at home with noted acute subcapital left femoral neck fracture  No head trauma or LOC Fall precautions  Continue PT OT   HTN (hypertension) BP stable  Continue current antihypertensives   Type 2 diabetes mellitus (HCC) Continue insulin therapy Monitor glucose level closely   Hypothyroidism Continue Synthroid     Advance Care Planning:   Code Status: Full Code    Consults: Orthopedic surgery w/ Dr. Hyacinth Meeker      Physical Exam:  Appearance: She is normal weight.  HENT:     Head: Normocephalic and atraumatic.     Nose: Nose normal.     Mouth/Throat:     Mouth: Mucous membranes are moist.  Eyes:     Pupils: Pupils are equal, round, and reactive to light.  Cardiovascular:     Rate and Rhythm: Normal rate and regular rhythm.  Pulmonary:     Effort: Pulmonary effort is normal.     Breath sounds: Normal breath sounds.  Abdominal:     General: Bowel sounds are normal.  Musculoskeletal:     Cervical back: Normal range of motion.  Left hip dressing clean  and dry Skin:    General: Skin is warm.  Neurological:     General: No focal deficit present.  Psychiatric:        Mood and Affect: Mood normal.      Data Reviewed: Labs reviewed by me today sodium is 135 potassium 4.1 hemoglobin 9.1          Vitals:   08/21/22 0102 08/21/22 0751 08/21/22 1510 08/21/22 1510  BP: (!) 140/57 (!) 127/53 (!) 149/71 (!) 149/71  Pulse: 99 84 89 89  Resp: 20 16 16 15   Temp: 98.2 F (36.8 C)  98.8 F (37.1 C) 98.5 F (36.9 C) 98.5 F (36.9 C)  TempSrc:   Oral   SpO2: 94% 97% 97% 97%  Weight:      Height:         Author: Loyce Dys, MD 08/21/2022 4:35 PM  For on call review [email or web address].

## 2022-08-21 NOTE — Progress Notes (Signed)
Subjective: 2 Days Post-Op Procedure(s) (LRB): ARTHROPLASTY BIPOLAR HIP (HEMIARTHROPLASTY) (Left) Patient is out of bed in the chair and alert and comfortable.  The left hip is stable.  Neurovascular status good.  Dressing is dry.  She will require skilled nursing placement.  Patient reports pain as mild.  Objective:   VITALS:   Vitals:   08/21/22 0102 08/21/22 0751  BP: (!) 140/57 (!) 127/53  Pulse: 99 84  Resp: 20 16  Temp: 98.2 F (36.8 C) 98.8 F (37.1 C)  SpO2: 94% 97%    Neurologically intact Dorsiflexion/Plantar flexion intact Incision: dressing C/D/I  LABS Recent Labs    08/19/22 0419 08/20/22 0559 08/21/22 0429  HGB 11.7* 9.8* 9.1*  HCT 35.1* 29.4* 27.5*  WBC 10.0 8.3 6.1  PLT 157 144* 126*    Recent Labs    08/19/22 0419 08/20/22 0559 08/21/22 0429  NA 129* 138 135  K 3.1* 3.2* 4.1  BUN 10 12 11   CREATININE 0.47 0.51 0.46  GLUCOSE 152* 111* 178*    No results for input(s): "LABPT", "INR" in the last 72 hours.   Assessment/Plan: 2 Days Post-Op Procedure(s) (LRB): ARTHROPLASTY BIPOLAR HIP (HEMIARTHROPLASTY) (Left)   Advance diet Up with therapy Discharge to SNF when medically stable Discharge on 81 mg ASA twice daily for 6 weeks Return to my office in 2 weeks for suture removal and x-ray

## 2022-08-21 NOTE — Progress Notes (Addendum)
Physical Therapy Treatment Patient Details Name: Wanda Baird MRN: [id number] DOB: 1927/08/29 Today's Date: 08/21/2022   History of Present Illness Wanda Baird is a 89 y.o. female presenting after a mechanical fall, imaging shows left hip fracture, s/p L hemiarthroplasty 6/23. PMH significant for HTN, DM, hypothyroidism    PT Comments    Pt is A&Ox4, in chair. Pt reports 5/10 pain in hip and improvement in nausea symptoms. Pt able to perform transfers c/ minA and RW, cueing need for hand placement. Pt ambulating c/ CGA and RW with max VC and demonstration for step-to pattern to abide by precautions, pt demonstrates difficulty with adhering to precautions, further ambulation deferred. Pt performed exercises seated in chair. Pt left in chair, alarm set, needs within reach, and MD in room. PT communicated with MD about WB precaution concerns during ambulation. Pt would benefit from acute therapy services to improve mobility, safe behaviors, and functional activity tolerance.    Recommendations for follow up therapy are one component of a multi-disciplinary discharge planning process, led by the attending physician.  Recommendations may be updated based on patient status, additional functional criteria and insurance authorization.  Follow Up Recommendations  Can patient physically be transported by private vehicle: Yes    Assistance Recommended at Discharge Frequent or constant Supervision/Assistance  Patient can return home with the following A lot of help with walking and/or transfers;A lot of help with bathing/dressing/bathroom;Assistance with cooking/housework;Assist for transportation;Help with stairs or ramp for entrance;Direct supervision/assist for medications management   Equipment Recommendations  Other (comment) (TBD next venue of care)    Recommendations for Other Services       Precautions / Restrictions Precautions Precautions: Posterior  Hip;Fall Restrictions Weight Bearing Restrictions: Yes LLE Weight Bearing: Partial weight bearing LLE Partial Weight Bearing Percentage or Pounds: 50%     Mobility  Bed Mobility                    Transfers Overall transfer level: Needs assistance Equipment used: Rolling walker (2 wheels) Transfers: Sit to/from Stand Sit to Stand: Min assist                Ambulation/Gait Ambulation/Gait assistance: Min guard Gait Distance (Feet): 50 Feet Assistive device: Rolling walker (2 wheels) Gait Pattern/deviations: Step-to pattern, Decreased stride length, Decreased step length - left, Decreased stance time - left       General Gait Details: pt unable to maintain WB precautions with repeated cueing, deferred further ambulation. needs cueing to slow down for safety and precautions   Stairs             Wheelchair Mobility    Modified Rankin (Stroke Patients Only)       Balance Overall balance assessment: Needs assistance Sitting-balance support: No upper extremity supported, Feet supported Sitting balance-Leahy Scale: Good     Standing balance support: Bilateral upper extremity supported Standing balance-Leahy Scale: Fair                              Cognition Arousal/Alertness: Awake/alert Behavior During Therapy: WFL for tasks assessed/performed Overall Cognitive Status: Within Functional Limits for tasks assessed                                 General Comments: Pt able to recall hip precautions with minimal verbal cueing.        Exercises  Total Joint Exercises Ankle Circles/Pumps: AROM, Both, 20 reps, Seated Long Arc Quad: AROM, Both, 20 reps, Seated    General Comments        Pertinent Vitals/Pain Pain Assessment Pain Assessment: 0-10 Pain Score: 5  Pain Descriptors / Indicators: Aching, Sore Pain Intervention(s): Limited activity within patient's tolerance, Monitored during session, Repositioned,  Premedicated before session    Home Living                          Prior Function            PT Goals (current goals can now be found in the care plan section) Progress towards PT goals: Progressing toward goals    Frequency    7X/week      PT Plan Current plan remains appropriate    Co-evaluation              AM-PAC PT "6 Clicks" Mobility   Outcome Measure  Help needed turning from your back to your side while in a flat bed without using bedrails?: A Little Help needed moving from lying on your back to sitting on the side of a flat bed without using bedrails?: A Little Help needed moving to and from a bed to a chair (including a wheelchair)?: A Little Help needed standing up from a chair using your arms (e.g., wheelchair or bedside chair)?: A Little Help needed to walk in hospital room?: A Little Help needed climbing 3-5 steps with a railing? : A Lot 6 Click Score: 17    End of Session Equipment Utilized During Treatment: Gait belt Activity Tolerance: Patient tolerated treatment well Patient left: in chair;with chair alarm set;with call bell/phone within reach Nurse Communication: Mobility status PT Visit Diagnosis: History of falling (Z91.81);Muscle weakness (generalized) (M62.81);Other abnormalities of gait and mobility (R26.89)     Time: 4098-1191 PT Time Calculation (min) (ACUTE ONLY): 32 min  Charges:                        Lala Lund, PT, SPT  11:50 AM,08/21/22

## 2022-08-22 DIAGNOSIS — D696 Thrombocytopenia, unspecified: Secondary | ICD-10-CM | POA: Insufficient documentation

## 2022-08-22 DIAGNOSIS — S72002A Fracture of unspecified part of neck of left femur, initial encounter for closed fracture: Secondary | ICD-10-CM | POA: Diagnosis not present

## 2022-08-22 DIAGNOSIS — D62 Acute posthemorrhagic anemia: Secondary | ICD-10-CM

## 2022-08-22 DIAGNOSIS — E1165 Type 2 diabetes mellitus with hyperglycemia: Secondary | ICD-10-CM | POA: Diagnosis not present

## 2022-08-22 LAB — IRON AND TIBC
Iron: 21 ug/dL — ABNORMAL LOW (ref 28–170)
Saturation Ratios: 11 % (ref 10.4–31.8)
TIBC: 195 ug/dL — ABNORMAL LOW (ref 250–450)
UIBC: 174 ug/dL

## 2022-08-22 LAB — CBC
HCT: 25.3 % — ABNORMAL LOW (ref 36.0–46.0)
Hemoglobin: 8.3 g/dL — ABNORMAL LOW (ref 12.0–15.0)
MCH: 30 pg (ref 26.0–34.0)
MCHC: 32.8 g/dL (ref 30.0–36.0)
MCV: 91.3 fL (ref 80.0–100.0)
Platelets: 135 10*3/uL — ABNORMAL LOW (ref 150–400)
RBC: 2.77 MIL/uL — ABNORMAL LOW (ref 3.87–5.11)
RDW: 13 % (ref 11.5–15.5)
WBC: 5.5 10*3/uL (ref 4.0–10.5)
nRBC: 0 % (ref 0.0–0.2)

## 2022-08-22 LAB — BASIC METABOLIC PANEL
Anion gap: 6 (ref 5–15)
BUN: 9 mg/dL (ref 8–23)
CO2: 24 mmol/L (ref 22–32)
Calcium: 7.9 mg/dL — ABNORMAL LOW (ref 8.9–10.3)
Chloride: 105 mmol/L (ref 98–111)
Creatinine, Ser: 0.44 mg/dL (ref 0.44–1.00)
GFR, Estimated: >60 mL/min (ref >60)
Glucose, Bld: 130 mg/dL — ABNORMAL HIGH (ref 70–99)
Potassium: 3.4 mmol/L — ABNORMAL LOW (ref 3.5–5.1)
Sodium: 135 mmol/L (ref 135–145)

## 2022-08-22 LAB — COMPREHENSIVE METABOLIC PANEL
ALT: 25 U/L (ref 0–44)
AST: 26 U/L (ref 15–41)
Albumin: 3.4 g/dL — ABNORMAL LOW (ref 3.5–5.0)
Anion gap: 12 (ref 5–15)
BUN: 10 mg/dL (ref 8–23)
CO2: 24 mmol/L (ref 22–32)
Calcium: 8 mg/dL — ABNORMAL LOW (ref 8.9–10.3)
Chloride: 99 mmol/L (ref 98–111)
Creatinine, Ser: 0.47 mg/dL (ref 0.44–1.00)
GFR, Estimated: >60 mL/min (ref >60)
Glucose, Bld: 152 mg/dL — ABNORMAL HIGH (ref 70–99)
Potassium: 3.1 mmol/L — ABNORMAL LOW (ref 3.5–5.1)
Total Protein: 5.7 g/dL — ABNORMAL LOW (ref 6.5–8.1)

## 2022-08-22 LAB — GLUCOSE, CAPILLARY
Glucose-Capillary: 126 mg/dL — ABNORMAL HIGH (ref 70–99)
Glucose-Capillary: 131 mg/dL — ABNORMAL HIGH (ref 70–99)
Glucose-Capillary: 132 mg/dL — ABNORMAL HIGH (ref 70–99)
Glucose-Capillary: 140 mg/dL — ABNORMAL HIGH (ref 70–99)
Glucose-Capillary: 164 mg/dL — ABNORMAL HIGH (ref 70–99)
Glucose-Capillary: 279 mg/dL — ABNORMAL HIGH (ref 70–99)

## 2022-08-22 LAB — FERRITIN: Ferritin: 207 ng/mL (ref 11–307)

## 2022-08-22 LAB — VITAMIN B12: Vitamin B-12: 1382 pg/mL — ABNORMAL HIGH (ref 180–914)

## 2022-08-22 MED ORDER — ONDANSETRON HCL 4 MG PO TABS
4.0000 mg | ORAL_TABLET | Freq: Four times a day (QID) | ORAL | Status: DC | PRN
  Administered 2022-08-22 – 2022-08-24 (×5): 4 mg via ORAL
  Filled 2022-08-22 (×5): qty 1

## 2022-08-22 MED ORDER — ONDANSETRON HCL 4 MG/2ML IJ SOLN: 4.0000 mg | INTRAMUSCULAR | Status: DC | PRN

## 2022-08-22 MED ORDER — POTASSIUM CHLORIDE CRYS ER 20 MEQ PO TBCR: 40.0000 meq | EXTENDED_RELEASE_TABLET | Freq: Once | ORAL | Status: DC

## 2022-08-22 MED ORDER — INSULIN ASPART 100 UNIT/ML IJ SOLN
0.0000 [IU] | Freq: Three times a day (TID) | INTRAMUSCULAR | Status: DC
  Administered 2022-08-23: 2 [IU] via SUBCUTANEOUS
  Filled 2022-08-22: qty 1

## 2022-08-22 MED ORDER — PANTOPRAZOLE SODIUM 40 MG PO TBEC
40.0000 mg | DELAYED_RELEASE_TABLET | Freq: Every day | ORAL | Status: DC
  Administered 2022-08-22 – 2022-08-24 (×3): 40 mg via ORAL
  Filled 2022-08-22 (×3): qty 1

## 2022-08-22 MED ORDER — KCL-LACTATED RINGERS 20 MEQ/L IV SOLN: INTRAVENOUS | Status: DC

## 2022-08-22 MED ORDER — POTASSIUM CHLORIDE 2 MEQ/ML IV SOLN
INTRAVENOUS | Status: DC
  Filled 2022-08-22 (×2): qty 1000

## 2022-08-22 NOTE — [place] FL2 (Signed)
[place] MEDICAID FL2 LEVEL OF CARE FORM     IDENTIFICATION  Patient Name: Wanda Baird Birthdate: 27-Oct-1927 Sex: female Admission Date (Current Location): 08/18/2022  [facility] and [place] Number:  Chiropodist and Address:  [facility], [address], [place], [place] [address]      Provider Number: [id number]  Attending Physician Name and Address:  Marrion Coy, MD  Relative Name and Phone Number:  Loma Sender [phone]    Current Level of Care: Hospital Recommended Level of Care: Skilled Nursing Facility Prior Approval Number:    Date Approved/Denied:   PASRR Number: [id number]  Discharge Plan: SNF    Current Diagnoses: Patient Active Problem List   Diagnosis Date Noted   Acute postoperative anemia due to expected blood loss 08/22/2022   Thrombocytopenia (HCC) 08/22/2022   Hip fracture (HCC) 08/18/2022   HLD (hyperlipidemia) 08/18/2022   Hypokalemia 08/18/2022   Fall 07/31/2018   Hypothyroidism 10/29/2006   Uncontrolled type 2 diabetes mellitus with hyperglycemia, without long-term current use of insulin (HCC) 10/29/2006   HTN (hypertension) 10/29/2006   OSTEOPOROSIS 10/29/2006    Orientation RESPIRATION BLADDER Height & Weight     Self, Time, Situation, Place  Normal Continent Weight: 59.9 kg Height:  5\' 4"  (162.6 cm)  BEHAVIORAL SYMPTOMS/MOOD NEUROLOGICAL BOWEL NUTRITION STATUS      Continent  (See Discharge Planning)  AMBULATORY STATUS COMMUNICATION OF NEEDS Skin   Extensive Assist Verbally Normal                       Personal Care Assistance Level of Assistance  Bathing, Feeding, Dressing Bathing Assistance: Limited assistance Feeding assistance: Limited assistance Dressing Assistance: Limited assistance     Functional Limitations Info  Sight, Hearing, Speech Sight Info: Adequate Hearing Info: Adequate Speech Info: Adequate    SPECIAL CARE FACTORS FREQUENCY  PT (By licensed PT), OT (By  licensed OT)     PT Frequency: 5x Weekly OT Frequency: 5x Weekly            Contractures Contractures Info: Not present    Additional Factors Info  Code Status, Allergies Code Status Info: Full Allergies Info: Metformin, Codeine, Morphine, Percocet (Oxycodone-acetaminophen)           Current Medications (08/22/2022):  This is the current hospital active medication list Current Facility-Administered Medications  Medication Dose Route Frequency Provider Last Rate Last Admin   acetaminophen (TYLENOL) tablet 325-650 mg  325-650 mg Oral Q6H PRN Deeann Saint, MD       acetaminophen (TYLENOL) tablet 650 mg  650 mg Oral Q6H PRN Loyce Dys, MD   650 mg at 08/22/22 1614   alum & mag hydroxide-simeth (MAALOX/MYLANTA) 200-200-20 MG/5ML suspension 30 mL  30 mL Oral Q4H PRN Deeann Saint, MD       bisacodyl (DULCOLAX) suppository 10 mg  10 mg Rectal Daily PRN Deeann Saint, MD       enoxaparin (LOVENOX) injection 30 mg  30 mg Subcutaneous Q24H Deeann Saint, MD   30 mg at 08/22/22 0930   insulin aspart (novoLOG) injection 0-6 Units  0-6 Units Subcutaneous TID WC Marrion Coy, MD       irbesartan (AVAPRO) tablet 300 mg  300 mg Oral Daily Orson Aloe, RPH   300 mg at 08/22/22 1017   lactated ringers 1,000 mL with potassium chloride 20 mEq infusion   Intravenous Continuous Drusilla Kanner, RPH 50 mL/hr at 08/22/22 1025 New Bag at 08/22/22 1025  levothyroxine (SYNTHROID) tablet 88 mcg  88 mcg Oral Q0600 Loyce Dys, MD   88 mcg at 08/22/22 2956   menthol-cetylpyridinium (CEPACOL) lozenge 3 mg  1 lozenge Oral PRN Deeann Saint, MD   3 mg at 08/19/22 1512   Or   phenol (CHLORASEPTIC) mouth spray 1 spray  1 spray Mouth/Throat PRN Deeann Saint, MD       methocarbamol (ROBAXIN) tablet 500 mg  500 mg Oral Q6H PRN Deeann Saint, MD   500 mg at 08/21/22 1616   Or   methocarbamol (ROBAXIN) 500 mg in dextrose 5 % 50 mL IVPB  500 mg Intravenous Q6H PRN Deeann Saint, MD        metoCLOPramide (REGLAN) tablet 5-10 mg  5-10 mg Oral Q8H PRN Deeann Saint, MD   10 mg at 08/19/22 1252   Or   metoCLOPramide (REGLAN) injection 5-10 mg  5-10 mg Intravenous Q8H PRN Deeann Saint, MD       ondansetron [facility]) tablet 4 mg  4 mg Oral Q6H PRN Marrion Coy, MD   4 mg at 08/22/22 1657   Or   ondansetron (ZOFRAN) injection 4 mg  4 mg Intravenous Q4H PRN Marrion Coy, MD       pantoprazole (PROTONIX) EC tablet 40 mg  40 mg Oral Daily Marrion Coy, MD   40 mg at 08/22/22 1017   promethazine (PHENERGAN) 12.5 mg in sodium chloride 0.9 % 50 mL IVPB  12.5 mg Intravenous Q6H PRN Merwyn Katos, MD 200 mL/hr at 08/22/22 0621 12.5 mg at 08/22/22 2130   rosuvastatin (CRESTOR) tablet 5 mg  5 mg Oral Penny Pia, MD   5 mg at 08/22/22 1017   sodium phosphate (FLEET) 7-19 GM/118ML enema 1 enema  1 enema Rectal Once PRN Deeann Saint, MD         Discharge Medications: Please see discharge summary for a list of discharge medications.  Relevant Imaging Results:  Relevant Lab Results:   Additional Information [id number]  Garret Reddish, RN

## 2022-08-22 NOTE — Progress Notes (Signed)
  Progress Note   Patient: Wanda Baird JWJ:[id number] DOB: 12/11/1927 DOA: 08/18/2022     4 DOS: the patient was seen and examined on 08/22/2022   Brief hospital course: Wanda Baird is a 89 y.o. female with medical history significant of type 2 diabetes, hypertension, hypothyroidism presenting with mechanical fall, left hip fracture.  Left hip hemiarthroplasty was performed on 6/23.   Principal Problem:   Hip fracture (HCC) Active Problems:   Hypothyroidism   Type 2 diabetes mellitus (HCC)   HTN (hypertension)   Fall   HLD (hyperlipidemia)   Hypokalemia   Assessment and Plan: Displaced subcapital left hip fracture (HCC) Mechanic fall. Patient is status post left hip hemiarthroplasty done by orthopedic surgeon on 08/19/2022 Patient refused to take any pain medicine, currently he is treated with Tylenol as needed.  She has been seen by PT/OT, recommended nursing home placement.  Nausea and vomiting. Patient has significant nausea and occasional vomiting before and after surgery.  However, she refused to take any IV Zofran or Phenergan.  I had a long discussion with her, explained that nausea vomiting probably due to stress from surgery and gastritis.  Protonix was started.  She is awaiting to take some oral Zofran. She has some loose stools x 2 yesterday, discontinued stool softener.   Hyponatremia likely secondary to dehydration Hypokalemia-continue potassium replacement Potassium is low again, patient refused to take any potassium supplement, I have added potassium into the IV fluids.  Sodium level has normalized.  Acute blood loss anemia. Thrombocytopenia Patient refused to take his iron pill due to irritation to the stomach.  Discontinued it.  Check iron and B12 level.  May give IV iron if significant iron deficiency.   HLD (hyperlipidemia) Continue Crestor    HTN (hypertension) BP stable  Continue home antihypertensives   Uncontrolled type 2 diabetes  with hyperglycemia. Glucose running high to 200s due to stress, I was able to convince patient to use sliding scale insulin.  Hold off oral diabetic medication to avoid hypoglycemia.   Hypothyroidism Continue Synthroid       Subjective:  Complaining significant nausea with occasional vomiting.  No abdominal pain.  She had 2 loose stools since last night.  Denies any shortness of breath.  Physical Exam: Vitals:   08/21/22 1510 08/21/22 1510 08/21/22 2201 08/22/22 0740  BP: (!) 149/71 (!) 149/71 (!) 131/53 136/67  Pulse: 89 89 83 85  Resp: 16 15 18 16   Temp: 98.5 F (36.9 C) 98.5 F (36.9 C)  98.3 F (36.8 C)  TempSrc: Oral   Oral  SpO2: 97% 97% 95% 100%  Weight:      Height:       General exam: Appears calm and comfortable  Respiratory system: Clear to auscultation. Respiratory effort normal. Cardiovascular system: S1 & S2 heard, RRR. No JVD, murmurs, rubs, gallops or clicks. No pedal edema. Gastrointestinal system: Abdomen is nondistended, soft and nontender. No organomegaly or masses felt. Normal bowel sounds heard. Central nervous system: Alert and oriented. No focal neurological deficits. Extremities: Symmetric 5 x 5 power. Skin: No rashes, lesions or ulcers Psychiatry: Judgement and insight appear normal. Mood & affect appropriate.    Data Reviewed:  Lab results reviewed.  Family Communication: Son updated at bedside.  Disposition: Status is: Inpatient Remains inpatient appropriate because: Severity of disease, IV treatment.     Time spent: 55 minutes  Author: Marrion Coy, MD 08/22/2022 1:22 PM  For on call review [email or web address].

## 2022-08-22 NOTE — Progress Notes (Signed)
Physical Therapy Treatment Patient Details Name: Wanda Baird MRN: [id number] DOB: 03/21/1927 Today's Date: 08/22/2022   History of Present Illness Wanda Baird is a 89 y.o. female presenting after a mechanical fall, imaging shows left hip fracture, s/p L hemiarthroplasty 6/23. PMH significant for HTN, DM, hypothyroidism    PT Comments    Pt received up in chair requesting assist to [facility]. Ongoing c/o nausea, IV site administering Phergan no longer intact, nursing notified. Pt completed mobility in room and short distance gait training with fair tolerance due to [facility] precautions L LE. Pt assisted back to bed for brief rest and placement of new IV. Will check back this pm once able to tolerate more activity. Pt however is making great progress, she is very pleasant and motivated. Currently she is requesting short term rehab at [facility] where her late husband previously lived. Pt has supportive family. Will continue to benefit from acute PT until cleared for d/c.   Recommendations for follow up therapy are one component of a multi-disciplinary discharge planning process, led by the attending physician.  Recommendations may be updated based on patient status, additional functional criteria and insurance authorization.  Follow Up Recommendations  Can patient physically be transported by private vehicle: Yes    Assistance Recommended at Discharge Frequent or constant Supervision/Assistance  Patient can return home with the following A lot of help with walking and/or transfers;A lot of help with bathing/dressing/bathroom;Assistance with cooking/housework;Assist for transportation;Help with stairs or ramp for entrance;Direct supervision/assist for medications management   Equipment Recommendations  Other (comment) (TBD at next facility)    Recommendations for Other Services       Precautions / Restrictions Precautions Precautions: Posterior Hip;Fall Precaution Booklet  Issued: Yes (comment) Precaution Comments:  (Reviewed with fair understanding of positional precautions, good recall on wt bearing precautions) Required Braces or Orthoses: Other Brace (ABD wedge) Restrictions Weight Bearing Restrictions: Yes LLE Weight Bearing: Partial weight bearing LLE Partial Weight Bearing Percentage or Pounds: 50%     Mobility  Bed Mobility Overal bed mobility: Needs Assistance Bed Mobility: Sit to Supine       Sit to supine: Mod assist (for B LE's)        Transfers Overall transfer level: Needs assistance Equipment used: Rolling walker (2 wheels) Transfers: Sit to/from Stand, Bed to chair/wheelchair/BSC Sit to Stand: Min assist   Step pivot transfers: Min assist       General transfer comment:  (Compliant with weight bearing precautions)    Ambulation/Gait Ambulation/Gait assistance: Min guard Gait Distance (Feet): 6 Feet Assistive device: Rolling walker (2 wheels) Gait Pattern/deviations: Step-to pattern, Decreased stride length, Decreased step length - left, Decreased stance time - left Gait velocity:  (decreased)         Stairs             Wheelchair Mobility    Modified Rankin (Stroke Patients Only)       Balance Overall balance assessment: Needs assistance Sitting-balance support: No upper extremity supported, Feet supported Sitting balance-Leahy Scale: Good     Standing balance support: Bilateral upper extremity supported, During functional activity, Reliant on assistive device for balance Standing balance-Leahy Scale: Fair                              Cognition Arousal/Alertness: Awake/alert Behavior During Therapy: WFL for tasks assessed/performed Overall Cognitive Status: Within Functional Limits for tasks assessed  General Comments:  (Very pleasant and cooperative. Able to follow multiple step commands without difficulty)        Exercises Total  Joint Exercises Ankle Circles/Pumps: AROM, Both, 5 reps Long Arc Quad: AROM, Both, 20 reps, Seated    General Comments General comments (skin integrity, edema, etc.): L hip dressing intact, assistance for hygiene given on BSC, c/c of nausea affecting mobility tolerance      Pertinent Vitals/Pain Pain Assessment Pain Assessment: 0-10 Pain Score: 2  Pain Location: posterior hip Pain Descriptors / Indicators: Aching, Sore Pain Intervention(s): Monitored during session    Home Living                          Prior Function            PT Goals (current goals can now be found in the care plan section) Acute Rehab PT Goals Patient Stated Goal: to return to home Progress towards PT goals: Progressing toward goals    Frequency    7X/week      PT Plan Current plan remains appropriate    Co-evaluation              AM-PAC PT "6 Clicks" Mobility   Outcome Measure  Help needed turning from your back to your side while in a flat bed without using bedrails?: A Little Help needed moving from lying on your back to sitting on the side of a flat bed without using bedrails?: A Little Help needed moving to and from a bed to a chair (including a wheelchair)?: A Little Help needed standing up from a chair using your arms (e.g., wheelchair or bedside chair)?: A Little Help needed to walk in hospital room?: A Little Help needed climbing 3-5 steps with a railing? : A Lot 6 Click Score: 17    End of Session Equipment Utilized During Treatment: Gait belt Activity Tolerance: Patient tolerated treatment well Patient left: in bed;with call bell/phone within reach;with bed alarm set;with nursing/sitter in room;with SCD's reapplied Nurse Communication: Mobility status PT Visit Diagnosis: History of falling (Z91.81);Muscle weakness (generalized) (M62.81);Other abnormalities of gait and mobility (R26.89)     Time: 6237-6283 PT Time Calculation (min) (ACUTE ONLY): 49  min  Charges:  $Gait Training: 8-22 mins $Therapeutic Exercise: 8-22 mins $Therapeutic Activity: 8-22 mins                    Zadie Cleverly, PTA  Wanda Baird 08/22/2022, 12:12 PM

## 2022-08-22 NOTE — Progress Notes (Signed)
Subjective: 3 Days Post-Op Procedure(s) (LRB): ARTHROPLASTY BIPOLAR HIP (HEMIARTHROPLASTY) (Left) Patient is out of bed in a chair.  She is having diarrhea today.  Hemoglobin is 8.3.  She is having minimal pain.  Still has nausea at times as well.  Plans are being made for skilled nursing.  Patient reports pain as mild.  Objective:   VITALS:   Vitals:   08/21/22 2201 08/22/22 0740  BP: (!) 131/53 136/67  Pulse: 83 85  Resp: 18 16  Temp:  98.3 F (36.8 C)  SpO2: 95% 100%    Neurologically intact Dorsiflexion/Plantar flexion intact Incision: dressing C/D/I  LABS Recent Labs    08/20/22 0559 08/21/22 0429 08/22/22 0412  HGB 9.8* 9.1* 8.3*  HCT 29.4* 27.5* 25.3*  WBC 8.3 6.1 5.5  PLT 144* 126* 135*    Recent Labs    08/20/22 0559 08/21/22 0429 08/22/22 0412  NA 138 135 135  K 3.2* 4.1 3.4*  BUN 12 11 9   CREATININE 0.51 0.46 0.44  GLUCOSE 111* 178* 130*    No results for input(s): "LABPT", "INR" in the last 72 hours.   Assessment/Plan: 3 Days Post-Op Procedure(s) (LRB): ARTHROPLASTY BIPOLAR HIP (HEMIARTHROPLASTY) (Left)   Advance diet Up with therapy Discharge to SNF Discharge on enteric-coated ASA 81 mg twice daily for 6 weeks Return to my office in 2 weeks for x-ray and staple removal

## 2022-08-22 NOTE — Hospital Course (Signed)
Wanda Baird is a 89 y.o. female with medical history significant of type 2 diabetes, hypertension, hypothyroidism presenting with mechanical fall, left hip fracture.  Left hip hemiarthroplasty was performed on 6/23.

## 2022-08-23 DIAGNOSIS — E1165 Type 2 diabetes mellitus with hyperglycemia: Secondary | ICD-10-CM | POA: Diagnosis not present

## 2022-08-23 DIAGNOSIS — D62 Acute posthemorrhagic anemia: Secondary | ICD-10-CM | POA: Diagnosis not present

## 2022-08-23 DIAGNOSIS — S72002A Fracture of unspecified part of neck of left femur, initial encounter for closed fracture: Secondary | ICD-10-CM | POA: Diagnosis not present

## 2022-08-23 LAB — CBC
HCT: 28.7 % — ABNORMAL LOW (ref 36.0–46.0)
Hemoglobin: 9.6 g/dL — ABNORMAL LOW (ref 12.0–15.0)
MCH: 30.7 pg (ref 26.0–34.0)
MCHC: 33.4 g/dL (ref 30.0–36.0)
MCV: 91.7 fL (ref 80.0–100.0)
Platelets: 169 10*3/uL (ref 150–400)
RBC: 3.13 MIL/uL — ABNORMAL LOW (ref 3.87–5.11)
RDW: 13 % (ref 11.5–15.5)
WBC: 5.2 10*3/uL (ref 4.0–10.5)
nRBC: 0 % (ref 0.0–0.2)

## 2022-08-23 LAB — GLUCOSE, CAPILLARY
Glucose-Capillary: 135 mg/dL — ABNORMAL HIGH (ref 70–99)
Glucose-Capillary: 144 mg/dL — ABNORMAL HIGH (ref 70–99)
Glucose-Capillary: 184 mg/dL — ABNORMAL HIGH (ref 70–99)
Glucose-Capillary: 195 mg/dL — ABNORMAL HIGH (ref 70–99)
Glucose-Capillary: 199 mg/dL — ABNORMAL HIGH (ref 70–99)
Glucose-Capillary: 214 mg/dL — ABNORMAL HIGH (ref 70–99)

## 2022-08-23 LAB — BASIC METABOLIC PANEL
Anion gap: 7 (ref 5–15)
BUN: 7 mg/dL — ABNORMAL LOW (ref 8–23)
CO2: 26 mmol/L (ref 22–32)
Calcium: 7.9 mg/dL — ABNORMAL LOW (ref 8.9–10.3)
Chloride: 103 mmol/L (ref 98–111)
Creatinine, Ser: 0.49 mg/dL (ref 0.44–1.00)
GFR, Estimated: >60 mL/min (ref >60)
Glucose, Bld: 170 mg/dL — ABNORMAL HIGH (ref 70–99)
Potassium: 3.7 mmol/L (ref 3.5–5.1)
Sodium: 136 mmol/L (ref 135–145)

## 2022-08-23 LAB — MAGNESIUM: Magnesium: 1.9 mg/dL (ref 1.7–2.4)

## 2022-08-23 MED ORDER — SCOPOLAMINE 1 MG/3DAYS TD PT72
1.0000 | MEDICATED_PATCH | TRANSDERMAL | Status: DC
  Administered 2022-08-23: 1.5 mg via TRANSDERMAL
  Filled 2022-08-23: qty 1

## 2022-08-23 NOTE — Progress Notes (Signed)
Subjective: 4 Days Post-Op Procedure(s) (LRB): ARTHROPLASTY BIPOLAR HIP (HEMIARTHROPLASTY) (Left) Patient is out of bed to chair and feels much better today.  Nausea is better.  Hemoglobin is 9.6.  Making some progress with PT.  Dressing is dry.  Leg  and hip are stable.  Patient reports pain as mild.  Objective:   VITALS:   Vitals:   08/23/22 0007 08/23/22 0744  BP: (!) 147/59 138/62  Pulse: 82 85  Resp: 20 17  Temp: 98.2 F (36.8 C) 99 F (37.2 C)  SpO2: 97% 96%    Neurologically intact Dorsiflexion/Plantar flexion intact Incision: dressing C/D/I  LABS Recent Labs    08/21/22 0429 08/22/22 0412 08/23/22 0559  HGB 9.1* 8.3* 9.6*  HCT 27.5* 25.3* 28.7*  WBC 6.1 5.5 5.2  PLT 126* 135* 169    Recent Labs    08/21/22 0429 08/22/22 0412 08/23/22 0559  NA 135 135 136  K 4.1 3.4* 3.7  BUN 11 9 7*  CREATININE 0.46 0.44 0.49  GLUCOSE 178* 130* 170*    No results for input(s): "LABPT", "INR" in the last 72 hours.   Assessment/Plan: 4 Days Post-Op Procedure(s) (LRB): ARTHROPLASTY BIPOLAR HIP (HEMIARTHROPLASTY) (Left)   Advance diet Up with therapy Discharge to SNF   Discharge on 81 mg ASA twice daily for 6 weeks Return to my office in 2 weeks.

## 2022-08-23 NOTE — Progress Notes (Signed)
Physical Therapy Treatment Patient Details Name: Wanda Baird MRN: [id number] DOB: 01/27/1928 Today's Date: 08/23/2022   History of Present Illness Wanda Baird is a 89 y.o. female presenting after a mechanical fall, imaging shows left hip fracture, s/p L hemiarthroplasty 6/23. PMH significant for HTN, DM, hypothyroidism    PT Comments    Pt has made excellent progress this date. Transfers have improved to CG/MinA from various surfaces. Gait distance increased to 62' with RW and CGA. Pt does require repeated vc's to maintain wt bearing precautions due to lack of pain. Time spent reviewing precautions, exercise program, proper car transfers, and preparation for transition to short term rehab prior to returning home. Will continue to progress pt who is very cooperative and motivated to participate.    Recommendations for follow up therapy are one component of a multi-disciplinary discharge planning process, led by the attending physician.  Recommendations may be updated based on patient status, additional functional criteria and insurance authorization.  Follow Up Recommendations  Can patient physically be transported by private vehicle: Yes    Assistance Recommended at Discharge Frequent or constant Supervision/Assistance  Patient can return home with the following A lot of help with walking and/or transfers;A lot of help with bathing/dressing/bathroom;Assistance with cooking/housework;Assist for transportation;Help with stairs or ramp for entrance;Direct supervision/assist for medications management   Equipment Recommendations  Other (comment) (TBD at next venue)    Recommendations for Other Services       Precautions / Restrictions Precautions Precautions: Posterior Hip;Fall Precaution Booklet Issued: Yes (comment) Precaution Comments:  (Handouts given and reviewed) Required Braces or Orthoses: Other Brace Other Brace:  (Abd Wedge) Restrictions Weight Bearing  Restrictions: Yes LLE Weight Bearing: Partial weight bearing LLE Partial Weight Bearing Percentage or Pounds: 50%     Mobility  Bed Mobility                    Transfers Overall transfer level: Needs assistance Equipment used: Rolling walker (2 wheels) Transfers: Sit to/from Stand Sit to Stand: Min assist           General transfer comment:  (Cues for technique)    Ambulation/Gait Ambulation/Gait assistance: Min guard Gait Distance (Feet):  (65) Assistive device: Rolling walker (2 wheels) Gait Pattern/deviations: Step-to pattern, Decreased stride length, Decreased step length - left, Decreased stance time - left Gait velocity: decreased     General Gait Details:  (Cues to maintain PWB precautions, pt with heavy reliance on RW for compiance)   Stairs             Wheelchair Mobility    Modified Rankin (Stroke Patients Only)       Balance Overall balance assessment: Needs assistance Sitting-balance support: No upper extremity supported, Feet supported                                        Cognition Arousal/Alertness: Awake/alert Behavior During Therapy: WFL for tasks assessed/performed Overall Cognitive Status: Within Functional Limits for tasks assessed                                 General Comments: Pt able to recall hip precautions with minimal verbal cueing.        Exercises Total Joint Exercises Ankle Circles/Pumps: AROM, Both, Seated, 15 reps Long Arc Quad: AROM, Both, 15 reps, Seated  General Comments General comments (skin integrity, edema, etc.): dresing intact pre/post session; HR 88, spo2 95% on RA after mobility      Pertinent Vitals/Pain Pain Assessment Pain Assessment: No/denies pain    Home Living                          Prior Function            PT Goals (current goals can now be found in the care plan section) Acute Rehab PT Goals Patient Stated Goal: to return to  home Progress towards PT goals: Progressing toward goals    Frequency    7X/week      PT Plan Current plan remains appropriate    Co-evaluation              AM-PAC PT "6 Clicks" Mobility   Outcome Measure  Help needed turning from your back to your side while in a flat bed without using bedrails?: A Little Help needed moving from lying on your back to sitting on the side of a flat bed without using bedrails?: A Little Help needed moving to and from a bed to a chair (including a wheelchair)?: A Little Help needed standing up from a chair using your arms (e.g., wheelchair or bedside chair)?: A Little Help needed to walk in hospital room?: A Little Help needed climbing 3-5 steps with a railing? : A Lot 6 Click Score: 17    End of Session Equipment Utilized During Treatment: Gait belt Activity Tolerance: Patient tolerated treatment well Patient left: in chair;with call bell/phone within reach;with chair alarm set Nurse Communication: Mobility status PT Visit Diagnosis: History of falling (Z91.81);Muscle weakness (generalized) (M62.81);Other abnormalities of gait and mobility (R26.89)     Time: 0102-7253 PT Time Calculation (min) (ACUTE ONLY): 42 min  Charges:  $Gait Training: 8-22 mins $Therapeutic Exercise: 8-22 mins $Therapeutic Activity: 8-22 mins                    Zadie Cleverly, PTA  Jannet Askew 08/23/2022, 2:31 PM

## 2022-08-23 NOTE — Progress Notes (Signed)
  Progress Note   Patient: Wanda Baird UJW:[id number] DOB: 05-Sep-1927 DOA: 08/18/2022     5 DOS: the patient was seen and examined on 08/23/2022   Brief hospital course: Wanda Baird is a 89 y.o. female with medical history significant of type 2 diabetes, hypertension, hypothyroidism presenting with mechanical fall, left hip fracture.  Left hip hemiarthroplasty was performed on 6/23.   Principal Problem:   Hip fracture (HCC) Active Problems:   Hypothyroidism   Uncontrolled type 2 diabetes mellitus with hyperglycemia, without long-term current use of insulin (HCC)   HTN (hypertension)   Fall   HLD (hyperlipidemia)   Hypokalemia   Acute postoperative anemia due to expected blood loss   Thrombocytopenia (HCC)   Assessment and Plan: Displaced subcapital left hip fracture (HCC) Mechanic fall. Patient is status post left hip hemiarthroplasty done by orthopedic surgeon on 08/19/2022 Patient refused to take any pain medicine, currently he is treated with Tylenol as needed.  She has been seen by PT/OT, recommended nursing home placement. Discussed with TOC, patient is pending bed offer.  Nausea and vomiting. Condition much improved, tolerating diet.  Patient states that she has a chronic intermittent nausea vomiting, scopolamine patch started. Continue Protonix for now.   Hyponatremia likely secondary to dehydration Hypokalemia-continue potassium replacement Discharge improved.  Discontinue fluids.   Acute blood loss anemia. Thrombocytopenia Patient has a minimal iron deficiency, B12 level elevated.  Hemoglobin is better today.   HLD (hyperlipidemia) Continue Crestor     HTN (hypertension) BP stable  Continue home antihypertensives   Uncontrolled type 2 diabetes with hyperglycemia. Continue sliding scale insulin.   Hypothyroidism Continue Synthroid       Subjective:  Patient feel much better today, tolerating diet well without nausea  vomiting.  Physical Exam: Vitals:   08/22/22 0740 08/22/22 1442 08/23/22 0007 08/23/22 0744  BP: 136/67 (!) 143/64 (!) 147/59 138/62  Pulse: 85 85 82 85  Resp: 16 16 20 17   Temp: 98.3 F (36.8 C) 97.7 F (36.5 C) 98.2 F (36.8 C) 99 F (37.2 C)  TempSrc: Oral Oral Oral   SpO2: 100% 96% 97% 96%  Weight:      Height:       General exam: Appears calm and comfortable  Respiratory system: Clear to auscultation. Respiratory effort normal. Cardiovascular system: S1 & S2 heard, RRR. No JVD, murmurs, rubs, gallops or clicks. No pedal edema. Gastrointestinal system: Abdomen is nondistended, soft and nontender. No organomegaly or masses felt. Normal bowel sounds heard. Central nervous system: Alert and oriented. No focal neurological deficits. Extremities: Symmetric 5 x 5 power. Skin: No rashes, lesions or ulcers Psychiatry: Judgement and insight appear normal. Mood & affect appropriate.    Data Reviewed:  Lab results reviewed.  Family Communication: None  Disposition: Status is: Inpatient Remains inpatient appropriate because: Unsafe discharge, pending nursing home placement.     Time spent: 35 minutes  Author: Marrion Coy, MD 08/23/2022 11:09 AM  For on call review [email or web address].

## 2022-08-23 NOTE — Progress Notes (Signed)
Occupational Therapy Treatment Patient Details Name: Wanda Baird MRN: [id number] DOB: 1927/07/23 Today's Date: 08/23/2022   History of present illness Wanda Baird is a 89 y.o. female presenting after a mechanical fall, imaging shows left hip fracture, s/p L hemiarthroplasty 6/23. PMH significant for HTN, DM, hypothyroidism   OT comments  Chart reviewed, pt greeted in bed agreeable to OT tx session. OT tx session targeted improving safe, independent ADL performance with precautions s/p hip surgery. Pt reports no pain and decreased nausea on this date. Improvements noted in functional mobility,toile transfer, grooming performance. Frequent vcs required throughout for adherence to partial weight bearing precautions. Pt continues to perform ADL below baseline, OT will continue to follow acutely.    Recommendations for follow up therapy are one component of a multi-disciplinary discharge planning process, led by the attending physician.  Recommendations may be updated based on patient status, additional functional criteria and insurance authorization.    Assistance Recommended at Discharge Intermittent Supervision/Assistance  Patient can return home with the following  A little help with walking and/or transfers;A little help with bathing/dressing/bathroom;Assistance with cooking/housework;Assist for transportation;Help with stairs or ramp for entrance   Equipment Recommendations  Other (comment) (per next venue of care)    Recommendations for Other Services      Precautions / Restrictions Precautions Precautions: Posterior Hip;Fall Required Braces or Orthoses: Other Brace (abd wedge) Restrictions Weight Bearing Restrictions: Yes LLE Weight Bearing: Partial weight bearing LLE Partial Weight Bearing Percentage or Pounds: 50%       Mobility Bed Mobility Overal bed mobility: Needs Assistance Bed Mobility: Supine to Sit     Supine to sit: Supervision, HOB elevated           Transfers Overall transfer level: Needs assistance Equipment used: Rolling walker (2 wheels) Transfers: Sit to/from Stand Sit to Stand: Min guard           General transfer comment: frequent verbal cues to adhere to precautions     Balance Overall balance assessment: Needs assistance Sitting-balance support: No upper extremity supported, Feet supported Sitting balance-Leahy Scale: Good     Standing balance support: Bilateral upper extremity supported, During functional activity, Reliant on assistive device for balance Standing balance-Leahy Scale: Fair                             ADL either performed or assessed with clinical judgement   ADL Overall ADL's : Needs assistance/impaired Eating/Feeding: Set up;Sitting   Grooming: Wash/dry hands;Wash/dry face;Modified independent;Standing Grooming Details (indicate cue type and reason): sink level with RW, frequent vcs for adherence to precautions             Lower Body Dressing: Minimal assistance   Toilet Transfer: Min guard;Rolling walker (2 wheels);Cueing for safety;Ambulation Toilet Transfer Details (indicate cue type and reason): frequent vcs for precautions Toileting- Clothing Manipulation and Hygiene: Sit to/from stand;Min guard Toileting - Clothing Manipulation Details (indicate cue type and reason): frequent vcs for precautions     Functional mobility during ADLs: Min guard;Rolling walker (2 wheels) (approx 15' in room 2 attempts, fair adherence to precautions)        Cognition Arousal/Alertness: Awake/alert Behavior During Therapy: WFL for tasks assessed/performed Overall Cognitive Status: Within Functional Limits for tasks assessed  Exercises Other Exercises Other Exercises: edu re: role of OT, role of rehab, safe ADL completion with AE PRN    Shoulder Instructions       General Comments dresing intact pre/post  session; HR 88, spo2 95% on RA after mobility    Pertinent Vitals/ Pain       Pain Assessment Pain Assessment: No/denies pain         Frequency  Min 1X/week        Progress Toward Goals  OT Goals(current goals can now be found in the care plan section)  Progress towards OT goals: Progressing toward goals     Plan Discharge plan remains appropriate;Frequency remains appropriate    Co-evaluation                 AM-PAC OT "6 Clicks" Daily Activity     Outcome Measure   Help from another person eating meals?: None Help from another person taking care of personal grooming?: None Help from another person toileting, which includes using toliet, bedpan, or urinal?: A Little Help from another person bathing (including washing, rinsing, drying)?: A Lot Help from another person to put on and taking off regular upper body clothing?: A Little Help from another person to put on and taking off regular lower body clothing?: A Lot 6 Click Score: 18    End of Session Equipment Utilized During Treatment: Gait belt;Rolling walker (2 wheels)  OT Visit Diagnosis: Unsteadiness on feet (R26.81);History of falling (Z91.81);Muscle weakness (generalized) (M62.81)   Activity Tolerance Patient tolerated treatment well   Patient Left in chair;with call bell/phone within reach;with chair alarm set   Nurse Communication          Time: 9629-5284 OT Time Calculation (min): 27 min  Charges: OT General Charges $OT Visit: 1 Visit OT Treatments $Self Care/Home Management : 23-37 mins  Oleta Mouse, OTD OTR/L  08/23/22, 12:12 PM

## 2022-08-24 DIAGNOSIS — S72002A Fracture of unspecified part of neck of left femur, initial encounter for closed fracture: Secondary | ICD-10-CM | POA: Diagnosis not present

## 2022-08-24 DIAGNOSIS — D62 Acute posthemorrhagic anemia: Secondary | ICD-10-CM | POA: Diagnosis not present

## 2022-08-24 DIAGNOSIS — E1165 Type 2 diabetes mellitus with hyperglycemia: Secondary | ICD-10-CM | POA: Diagnosis not present

## 2022-08-24 LAB — GLUCOSE, CAPILLARY
Glucose-Capillary: 162 mg/dL — ABNORMAL HIGH (ref 70–99)
Glucose-Capillary: 166 mg/dL — ABNORMAL HIGH (ref 70–99)
Glucose-Capillary: 168 mg/dL — ABNORMAL HIGH (ref 70–99)
Glucose-Capillary: 251 mg/dL — ABNORMAL HIGH (ref 70–99)

## 2022-08-24 MED ORDER — ASPIRIN 81 MG PO TBEC: 81.0000 mg | DELAYED_RELEASE_TABLET | Freq: Two times a day (BID) | ORAL | 0 refills | Status: AC

## 2022-08-24 MED ORDER — SCOPOLAMINE 1 MG/3DAYS TD PT72: 1.0000 | MEDICATED_PATCH | TRANSDERMAL | 12 refills | Status: DC

## 2022-08-24 MED ORDER — PANTOPRAZOLE SODIUM 40 MG PO TBEC: 40.0000 mg | DELAYED_RELEASE_TABLET | Freq: Every day | ORAL | 1 refills | Status: DC

## 2022-08-24 NOTE — TOC Transition Note (Addendum)
Transition of Care [facility]) - CM/SW Discharge Note   Patient Details  Name: Wanda Baird MRN: [id number] Date of Birth: Jun 21, 1927  Transition of Care [facility]) CM/SW Contact:  Liliana Cline, LCSW Phone Number: 08/24/2022, 10:20 AM   Clinical Narrative:    Discharge to [facility] today. Room 118. Confirmed with Admissions Worker Crystal.  Updated MD, RN, and patient's son Harvie Heck.  Asked RN to call report. Son Harvie Heck states they are unable to transport, asked for EMS, is aware PT recs private vehicle. EMS paperwork completed and will call for EMS when patient is ready for transport.   11:10- Notified by RN that patient is ready for transport. [facility] arranged. Patient is next on the list. RN notified.     Final next level of care: Skilled Nursing Facility Barriers to Discharge: Barriers Resolved   Patient Goals and CMS Choice CMS Medicare.gov Compare Post Acute Care list provided to:: Patient Represenative (must comment) Choice offered to / list presented to : Adult Children  Discharge Placement                Patient chooses bed at: [facility] Patient to be transferred to facility by: [facility] Name of family member notified: Harvie Heck Patient and family notified of of transfer: 08/24/22  Discharge Plan and Services Additional resources added to the After Visit Summary for                                       Social Determinants of Health (SDOH) Interventions SDOH Screenings   Tobacco Use: Low Risk  (08/20/2022)     Readmission Risk Interventions     No data to display

## 2022-08-24 NOTE — Progress Notes (Signed)
Discharged.  AVS printed and reviewed. Report called to [place] at [facility]. All questions answered.

## 2022-08-24 NOTE — Discharge Summary (Signed)
Physician Discharge Summary   Patient: Wanda Baird MRN: [id number] DOB: 1927-04-14  Admit date:     08/18/2022  Discharge date: 08/24/22  Discharge Physician: Marrion Coy   PCP: Kandyce Rud, MD   Recommendations at discharge:   Follow-up with PCP in 1 week in the nursing home. Follow-up with orthopedics as scheduled.  Discharge Diagnoses: Principal Problem:   Hip fracture (HCC) Active Problems:   Hypothyroidism   Uncontrolled type 2 diabetes mellitus with hyperglycemia, without long-term current use of insulin (HCC)   HTN (hypertension)   Fall   HLD (hyperlipidemia)   Hypokalemia   Acute postoperative anemia due to expected blood loss   Thrombocytopenia (HCC)  Resolved Problems:   * No resolved hospital problems. [facility] Hospital Course: Wanda Baird is a 89 y.o. female with medical history significant of type 2 diabetes, hypertension, hypothyroidism presenting with mechanical fall, left hip fracture.  Left hip hemiarthroplasty was performed on 6/23.  Assessment and Plan: Displaced subcapital left hip fracture (HCC) Mechanic fall. Patient is status post left hip hemiarthroplasty done by orthopedic surgeon on 08/19/2022 Patient refused to take any pain medicine, currently he is treated with Tylenol as needed.  She has been seen by PT/OT, recommended nursing home placement. Patient has been accepted to nursing home, per recommendation from orthopedics, patient will be on aspirin 81 mg twice a day for 6 weeks.  Follow-up with orthopedics as scheduled.   Nausea and vomiting. Patient has been nauseous occasionally in the hospital, but she did much better yesterday and was tolerating diet the entire day.  She was nauseous again last evening after giving insulin shots.  She does not have any constipation or diarrhea.  She is tolerating diet again today, she is medically stable to be discharged.   Hyponatremia likely secondary to dehydration Hypokalemia-continue  potassium replacement Condition improved.   Acute blood loss anemia. Thrombocytopenia Patient has a minimal iron deficiency, B12 level elevated.  Hemoglobin is better.  She could not tolerate irons.    HLD (hyperlipidemia) Continue Crestor     HTN (hypertension) BP stable  Continue home antihypertensives   Uncontrolled type 2 diabetes with hyperglycemia. Resume home treatment.   Hypothyroidism Continue Synthroid        Consultants: Orthopedics Procedures performed: Hip  Disposition: Skilled nursing facility Diet recommendation:  Discharge Diet Orders (From admission, onward)     Start     Ordered   08/24/22 0000  Diet - low sodium heart healthy        08/24/22 1007           Cardiac diet DISCHARGE MEDICATION: Allergies as of 08/24/2022       Reactions   Metformin Diarrhea   Codeine    REACTION: nausea and vomiting   Morphine Nausea And Vomiting   Patient states that she cannot take morphine due to past experience with vomiting.    Percocet [oxycodone-acetaminophen] Nausea Only        Medication List     TAKE these medications    aspirin EC 81 MG tablet Take 1 tablet (81 mg total) by mouth 2 (two) times daily. Swallow whole.   Calcium Carbonate-Vitamin D3 600-400 MG-UNIT Tabs Take 1 tablet by mouth daily.   glimepiride 2 MG tablet Commonly known as: AMARYL Take 2 mg by mouth 2 (two) times daily.   levothyroxine 88 MCG tablet Commonly known as: SYNTHROID Take 88 mcg by mouth daily.   Multi-Vitamin tablet Take 1 tablet by mouth daily.  mupirocin ointment 2 % Commonly known as: BACTROBAN Apply 1 application topically daily. Qd to wounds   ondansetron 4 MG tablet Commonly known as: Zofran Take 1 tablet (4 mg total) by mouth every 8 (eight) hours as needed for up to 10 doses for nausea or vomiting.   pantoprazole 40 MG tablet Commonly known as: PROTONIX Take 1 tablet (40 mg total) by mouth daily.   rosuvastatin 5 MG tablet Commonly  known as: CRESTOR Take 5 mg by mouth every other day.   scopolamine 1 MG/3DAYS Commonly known as: TRANSDERM-SCOP Place 1 patch (1.5 mg total) onto the skin every 3 (three) days. Start taking on: August 26, 2022   valsartan-hydrochlorothiazide 320-25 MG tablet Commonly known as: DIOVAN-HCT Take 1 tablet by mouth daily.               Discharge Care Instructions  (From admission, onward)           Start     Ordered   08/24/22 0000  Discharge wound care:       Comments: Follow with orthopedics   08/24/22 1007            Follow-up Information     Deeann Saint, MD. Schedule an appointment as soon as possible for a visit in 2 week(s).   Specialty: Orthopedic Surgery Why: Please make an appointment for the patient before she leaves the hospital. Contact information: [address] [place] [address] [phone]                Discharge Exam: Ceasar Mons Weights   08/18/22 1037  Weight: 59.9 kg   General exam: Appears calm and comfortable  Respiratory system: Clear to auscultation. Respiratory effort normal. Cardiovascular system: S1 & S2 heard, RRR. No JVD, murmurs, rubs, gallops or clicks. No pedal edema. Gastrointestinal system: Abdomen is nondistended, soft and nontender. No organomegaly or masses felt. Normal bowel sounds heard. Central nervous system: Alert and oriented. No focal neurological deficits. Extremities: Symmetric 5 x 5 power. Skin: No rashes, lesions or ulcers Psychiatry: Judgement and insight appear normal. Mood & affect appropriate.    Condition at discharge: good  The results of significant diagnostics from this hospitalization (including imaging, microbiology, ancillary and laboratory) are listed below for reference.   Imaging Studies: DG Hip Port Unilat With Pelvis 1V Left  Result Date: 08/19/2022 CLINICAL DATA:  Status post hip hemiarthroplasty. EXAM: DG HIP (WITH OR WITHOUT PELVIS) 1V PORT LEFT COMPARISON:  08/18/2022  FINDINGS: Patient is immediately status post left hip hemiarthroplasty. No evidence for immediate hardware complication. Gas in the soft tissues is compatible with the immediate postoperative state. IMPRESSION: Status post left hip hemiarthroplasty without evidence for immediate hardware complication. Electronically Signed   By: Kennith Center M.D.   On: 08/19/2022 11:20   DG Chest Port 1 View  Result Date: 08/18/2022 CLINICAL DATA:  Fall. EXAM: PORTABLE CHEST 1 VIEW COMPARISON:  May 26, 2020. FINDINGS: The heart size and mediastinal contours are within normal limits. Both lungs are clear. The visualized skeletal structures are unremarkable. IMPRESSION: No active disease. Electronically Signed   By: Lupita Raider M.D.   On: 08/18/2022 11:11   DG Hip Unilat W or Wo Pelvis 2-3 Views Left  Result Date: 08/18/2022 CLINICAL DATA:  Status post fall with severe pain. EXAM: DG HIP (WITH OR WITHOUT PELVIS) 2-3V LEFT COMPARISON:  None Available. FINDINGS: There is an acute, subcapital left femoral neck fracture. Proximal displacement of the distal fracture fragments. No sign  of dislocation. Right hip appears located and intact. No signs of pelvic diastasis or fracture. IMPRESSION: Acute subcapital left femoral neck fracture. Electronically Signed   By: Signa Kell M.D.   On: 08/18/2022 11:11   NM Bone Scan Whole Body  Result Date: 08/15/2022 CLINICAL DATA:  Fall EXAM: NUCLEAR MEDICINE WHOLE BODY BONE SCAN TECHNIQUE: Whole body anterior and posterior images were obtained approximately 3 hours after intravenous injection of radiopharmaceutical. RADIOPHARMACEUTICALS:  22.6 mCi Technetium-102m MDP IV COMPARISON:  Shoulder and humerus x-rays from April 2024. FINDINGS: Increased activity is identified in bilateral shoulders, elbows, wrists and ankles which are probably degenerative changes. Also multifocal nonspecific cervicothoracic and lumbar spine activity which is also probably degenerative. There is normal  bilateral renal activity. Note is made of marked uptake indicating proximal right humerus corresponding to the presence of a fracture. There is also marked uptake in the mid lumbar spine also consistent with history of fracture. Nonspecific focal uptake identified in the anterior left fifth rib. This can be assessed further with x-rays or CT. IMPRESSION: 1. Nonspecific findings with multifocal joint and spine activity that is probably degenerative. 2. Marked uptake in the mid lumbar spine and right shoulder consistent with history of recent fractures. 3. Nonspecific focal uptake identified in the left fifth rib anteriorly. Consider x-ray or CT correlation for further evaluation. Electronically Signed   By: Layla Maw M.D.   On: 08/15/2022 19:55    Microbiology: Results for orders placed or performed during the hospital encounter of 05/26/20  Resp Panel by RT-PCR (Flu A&B, Covid) Nasopharyngeal Swab     Status: None   Collection Time: 05/26/20  9:50 PM   Specimen: Nasopharyngeal Swab; Nasopharyngeal(NP) swabs in vial transport medium  Result Value Ref Range Status   SARS Coronavirus 2 by RT PCR NEGATIVE NEGATIVE Final    Comment: (NOTE) SARS-CoV-2 target nucleic acids are NOT DETECTED.  The SARS-CoV-2 RNA is generally detectable in upper respiratory specimens during the acute phase of infection. The lowest concentration of SARS-CoV-2 viral copies this assay can detect is 138 copies/mL. A negative result does not preclude SARS-Cov-2 infection and should not be used as the sole basis for treatment or other patient management decisions. A negative result may occur with  improper specimen collection/handling, submission of specimen other than nasopharyngeal swab, presence of viral mutation(s) within the areas targeted by this assay, and inadequate number of viral copies(<138 copies/mL). A negative result must be combined with clinical observations, patient history, and  epidemiological information. The expected result is Negative.  Fact Sheet for Patients:  BloggerCourse.com  Fact Sheet for Healthcare Providers:  SeriousBroker.it  This test is no t yet approved or cleared by the [place] FDA and  has been authorized for detection and/or diagnosis of SARS-CoV-2 by FDA under an Emergency Use Authorization (EUA). This EUA will remain  in effect (meaning this test can be used) for the duration of the COVID-19 declaration under Section 564(b)(1) of the Act, 21 U.S.C.section 360bbb-3(b)(1), unless the authorization is terminated  or revoked sooner.       Influenza A by PCR NEGATIVE NEGATIVE Final   Influenza B by PCR NEGATIVE NEGATIVE Final    Comment: (NOTE) The Xpert Xpress SARS-CoV-2/FLU/RSV plus assay is intended as an aid in the diagnosis of influenza from Nasopharyngeal swab specimens and should not be used as a sole basis for treatment. Nasal washings and aspirates are unacceptable for Xpert Xpress SARS-CoV-2/FLU/RSV testing.  Fact Sheet for Patients: BloggerCourse.com  Fact Sheet for  Healthcare Providers: SeriousBroker.it  This test is not yet approved or cleared by the [place] and has been authorized for detection and/or diagnosis of SARS-CoV-2 by FDA under an Emergency Use Authorization (EUA). This EUA will remain in effect (meaning this test can be used) for the duration of the COVID-19 declaration under Section 564(b)(1) of the Act, 21 U.S.C. section 360bbb-3(b)(1), unless the authorization is terminated or revoked.  Performed at [facility], [address] [place], [place] [address]     Labs: CBC: Recent Labs  Lab 08/18/22 1053 08/19/22 0419 08/20/22 0559 08/21/22 0429 08/22/22 0412 08/23/22 0559  WBC 9.8 10.0 8.3 6.1 5.5 5.2  NEUTROABS 7.8*  --   --   --   --   --   HGB 13.0 11.7* 9.8* 9.1*  8.3* 9.6*  HCT 37.4 35.1* 29.4* 27.5* 25.3* 28.7*  MCV 87.8 90.7 91.6 92.9 91.3 91.7  PLT 188 157 144* 126* 135* 169   Basic Metabolic Panel: Recent Labs  Lab 08/18/22 1057 08/18/22 1847 08/19/22 0419 08/20/22 0559 08/21/22 0429 08/22/22 0412 08/23/22 0559  NA  --    < > 135 138 135 135 136  K  --    < > 3.1* 3.2* 4.1 3.4* 3.7  CL  --    < > 99 107 107 105 103  CO2  --    < > 24 24 21* 24 26  GLUCOSE  --    < > 152* 111* 178* 130* 170*  BUN  --    < > 10 12 11 9  7*  CREATININE  --    < > 0.47 0.51 0.46 0.44 0.49  CALCIUM  --    < > 8.0* 8.3* 7.9* 7.9* 7.9*  MG 2.0  --   --   --   --   --  1.9   < > = values in this interval not displayed.   Liver Function Tests: Recent Labs  Lab 08/18/22 1053 08/19/22 0419  AST 25 26  ALT 22 25  ALKPHOS 55 52  BILITOT 1.9* 2.0*  PROT 6.3* 5.7*  ALBUMIN 3.5 3.4*   CBG: Recent Labs  Lab 08/23/22 1637 08/23/22 2028 08/24/22 0016 08/24/22 0356 08/24/22 0748  GLUCAP 214* 199* 168* 166* 162*    Discharge time spent: greater than 30 minutes.  Signed: Marrion Coy, MD [facility] 08/24/2022

## 2022-08-24 NOTE — Progress Notes (Signed)
Physical Therapy Treatment Patient Details Name: Wanda Baird MRN: [id number] DOB: [birth year] Today's Date: 08/24/2022   History of Present Illness Wanda Baird is a 89 y.o. female presenting after a mechanical fall, imaging shows left hip fracture, s/p L hemiarthroplasty 6/23. PMH significant for HTN, DM, hypothyroidism    PT Comments    Pt continues to make excellent functional progress without c/o L hip pain. Transfers have improved to CG/Supervision, with use of RW for gait training 80+ ft. Pt however does require occasional cues to maintain cues to maintain PWB restrictions. Pt awaiting transfer to [facility] today for further therapy prior to returning home.    Recommendations for follow up therapy are one component of a multi-disciplinary discharge planning process, led by the attending physician.  Recommendations may be updated based on patient status, additional functional criteria and insurance authorization.  Follow Up Recommendations  Can patient physically be transported by private vehicle: Yes    Assistance Recommended at Discharge Frequent or constant Supervision/Assistance  Patient can return home with the following A lot of help with walking and/or transfers;A lot of help with bathing/dressing/bathroom;Assistance with cooking/housework;Assist for transportation;Help with stairs or ramp for entrance;Direct supervision/assist for medications management   Equipment Recommendations  Other (comment) (TBD at next facility)    Recommendations for Other Services       Precautions / Restrictions Precautions Precautions: Posterior Hip;Fall Precaution Booklet Issued: Yes (comment) Precaution Comments:  (Daily review of Posterior hip precautions) Required Braces or Orthoses: Other Brace Other Brace:  (Abd wedge) Restrictions Weight Bearing Restrictions: Yes LLE Weight Bearing: Partial weight bearing LLE Partial Weight Bearing Percentage or Pounds: 50%      Mobility  Bed Mobility               General bed mobility comments: In chair pre/post session    Transfers Overall transfer level: Needs assistance Equipment used: Rolling walker (2 wheels) Transfers: Sit to/from Stand Sit to Stand: Supervision, Min guard                Ambulation/Gait Ambulation/Gait assistance: Min guard Gait Distance (Feet):  (80) Assistive device: Rolling walker (2 wheels) Gait Pattern/deviations: Step-to pattern, Decreased stride length, Decreased step length - left, Decreased stance time - left Gait velocity: decreased     General Gait Details: Occassional cues to maintain PWB L LE   Stairs             Wheelchair Mobility    Modified Rankin (Stroke Patients Only)       Balance Overall balance assessment: Needs assistance Sitting-balance support: No upper extremity supported, Feet supported Sitting balance-Leahy Scale: Good     Standing balance support: Bilateral upper extremity supported, During functional activity, Reliant on assistive device for balance Standing balance-Leahy Scale: Fair Standing balance comment:  (Minimal LOB stepping backwards with RW)                            Cognition Arousal/Alertness: Awake/alert Behavior During Therapy: WFL for tasks assessed/performed Overall Cognitive Status: Within Functional Limits for tasks assessed                                 General Comments: Pt able to recall hip precautions with minimal verbal cueing.        Exercises Total Joint Exercises Ankle Circles/Pumps: AROM, Both, Seated, 15 reps Long Arc Quad: AROM, Both,  15 reps, Seated    General Comments General comments (skin integrity, edema, etc.): Education provided on hip precautions, proper gait sequencing, and precautions      Pertinent Vitals/Pain Pain Assessment Pain Assessment: No/denies pain    Home Living                          Prior Function             PT Goals (current goals can now be found in the care plan section) Acute Rehab PT Goals Patient Stated Goal: to return to home Progress towards PT goals: Progressing toward goals    Frequency    7X/week      PT Plan Current plan remains appropriate    Co-evaluation              AM-PAC PT "6 Clicks" Mobility   Outcome Measure  Help needed turning from your back to your side while in a flat bed without using bedrails?: A Little Help needed moving from lying on your back to sitting on the side of a flat bed without using bedrails?: A Little Help needed moving to and from a bed to a chair (including a wheelchair)?: A Little Help needed standing up from a chair using your arms (e.g., wheelchair or bedside chair)?: A Little Help needed to walk in hospital room?: A Little Help needed climbing 3-5 steps with a railing? : A Lot 6 Click Score: 17    End of Session Equipment Utilized During Treatment: Gait belt Activity Tolerance: Patient tolerated treatment well Patient left: in chair;with call bell/phone within reach;with chair alarm set Nurse Communication: Mobility status PT Visit Diagnosis: History of falling (Z91.81);Muscle weakness (generalized) (M62.81);Other abnormalities of gait and mobility (R26.89)     Time: 1018-1100 PT Time Calculation (min) (ACUTE ONLY): 42 min  Charges:  $Gait Training: 8-22 mins $Therapeutic Exercise: 8-22 mins $Therapeutic Activity: 8-22 mins                    Zadie Cleverly, PTA  Jannet Askew 08/24/2022, 11:34 AM

## 2022-08-24 NOTE — TOC Progression Note (Addendum)
Transition of Care [facility]) - Progression Note    Patient Details  Name: Wanda Baird MRN: [id number] Date of Birth: 1928-01-13  Transition of Care [facility]) CM/SW Contact  Liliana Cline, LCSW Phone Number: 08/24/2022, 9:08 AM  Clinical Narrative:    Crystal at [facility] to review referral to see if she can make a bed offer.   10:05- Crystal at [facility] can accept patient today. Notified MD and asked if medically ready.         Expected Discharge Plan and Services                                               Social Determinants of Health (SDOH) Interventions SDOH Screenings   Tobacco Use: Low Risk  (08/20/2022)    Readmission Risk Interventions     No data to display

## 2022-08-24 NOTE — Care Management Important Message (Signed)
Important Message  Patient Details  Name: Wanda Baird MRN: [id number] Date of Birth: 1927/09/14   Medicare Important Message Given:  Yes  I reviewed the Important Message from Medicare with the patient and obtained her signature. I will forward the form to Health Information Management to be scanned into her medical record. I thanked her for her time and wished her a speedy recovery.   Olegario Messier A Denisia Harpole 08/24/2022, 11:56 AM

## 2022-08-27 ENCOUNTER — Encounter: Payer: Self-pay | Admitting: Student

## 2022-08-27 ENCOUNTER — Non-Acute Institutional Stay (SKILLED_NURSING_FACILITY): Payer: Medicare Other | Admitting: Student

## 2022-08-27 DIAGNOSIS — I10 Essential (primary) hypertension: Secondary | ICD-10-CM

## 2022-08-27 DIAGNOSIS — S72002A Fracture of unspecified part of neck of left femur, initial encounter for closed fracture: Secondary | ICD-10-CM

## 2022-08-27 DIAGNOSIS — D696 Thrombocytopenia, unspecified: Secondary | ICD-10-CM | POA: Diagnosis not present

## 2022-08-27 DIAGNOSIS — D62 Acute posthemorrhagic anemia: Secondary | ICD-10-CM

## 2022-08-27 DIAGNOSIS — E039 Hypothyroidism, unspecified: Secondary | ICD-10-CM

## 2022-08-27 DIAGNOSIS — E1165 Type 2 diabetes mellitus with hyperglycemia: Secondary | ICD-10-CM | POA: Diagnosis not present

## 2022-08-27 NOTE — Progress Notes (Signed)
Provider:  Dr. Earnestine Mealing Location:  Other [place].  Nursing Home Room Number: [facility] 111A Place of Service:  SNF (31)  PCP: Kandyce Rud, MD Patient Care Team: Kandyce Rud, MD as PCP - General (Family Medicine)  Extended Emergency Contact Information Primary Emergency Contact: Inocencio Homes [place] of [place] Home Phone: [phone] Mobile Phone: [phone] Relation: Son  Code Status: Full Code.  Goals of Care: Advanced Directive information    08/27/2022    9:09 AM  Advanced Directives  Does Patient Have a Medical Advance Directive? No  Does patient want to make changes to medical advance directive? No - Patient declined      Chief Complaint  Patient presents with  . New Admit To SNF    Admission.     HPI: Patient is a 89 y.o. female seen today for admission to [facility].   She fell and broke her hip.   2nd injurous fall of the eyear-- broke her arm and was home alone for 3 weeks. Then now she had a bag of mulch and she wanted ot put it around her light post. She opened and went back on the brick wall. She is going to have someone else does   She lives at home alone. She does everything for herself.   She has some constipation. Miralax hasn't helped. Prune juice  She would like to  Denies pain with her arm or leg. Pain medicine "drives me crazy." Other things made her nauseated.   She is from [place] and moved to [place]. Her son is local. Her DIL died in 09-15-2007. She has 23 acres and they live on the same property. She did cooking at home. She fell 4 years ago as well. Doesn't get help with medications.   She doesn't want to treat osteoporosis. Afraid of side effect.   Past Medical History:  Diagnosis Date  . Actinic keratosis   . Diabetes mellitus without complication (HCC)   . Hx of dysplastic nevus 06/29/2008   R distal lateral thigh, mild to moderate  . Hypertension   . Squamous cell carcinoma of skin 05/07/2016   R  dorsum foot  . Squamous cell carcinoma of skin 12/24/2013   SCC IS R dorsal hand  . Squamous cell carcinoma of skin 10/10/2020   right medial ankle (in situ) - EDC 11/21/2020  . Squamous cell carcinoma of skin 10/10/2020   left dorsum 2nd toe (in situ) - EDC 11/21/2020  . Squamous cell carcinoma of skin 11/21/2020   L hand dorsum - [facility] ED&C   Past Surgical History:  Procedure Laterality Date  . BACK SURGERY    . HIP ARTHROPLASTY Left 08/19/2022   Procedure: ARTHROPLASTY BIPOLAR HIP (HEMIARTHROPLASTY);  Surgeon: Deeann Saint, MD;  Location: [facility] ORS;  Service: Orthopedics;  Laterality: Left;    reports that she has never smoked. She has never used smokeless tobacco. She reports that she does not drink alcohol and does not use drugs. Social History   Socioeconomic History  . Marital status: Widowed    Spouse name: Not on file  . Number of children: Not on file  . Years of education: Not on file  . Highest education level: Not on file  Occupational History  . Not on file  Tobacco Use  . Smoking status: Never  . Smokeless tobacco: Never  Substance and Sexual Activity  . Alcohol use: No  . Drug use: No  . Sexual activity: Not on file  Other  Topics Concern  . Not on file  Social History Narrative  . Not on file   Social Determinants of Health   Financial Resource Strain: Not on file  Food Insecurity: Not on file  Transportation Needs: Not on file  Physical Activity: Not on file  Stress: Not on file  Social Connections: Not on file  Intimate Partner Violence: Not on file    Functional Status Survey:    Family History  Problem Relation Age of Onset  . Breast cancer Maternal Aunt     Health Maintenance  Topic Date Due  . FOOT EXAM  Never done  . OPHTHALMOLOGY EXAM  Never done  . Zoster Vaccines- Shingrix (1 of 2) Never done  . DEXA SCAN  Never done  . DTaP/Tdap/Td (3 - Tdap) 02/27/2003  . Medicare Annual Wellness (AWV)  07/31/2016  . COVID-19 Vaccine (5 -  2023-24 season) 10/27/2021  . INFLUENZA VACCINE  09/27/2022  . HEMOGLOBIN A1C  02/17/2023  . Pneumonia Vaccine 54+ Years old  Completed  . HPV VACCINES  Aged Out    Allergies  Allergen Reactions  . Metformin Diarrhea  . Codeine     REACTION: nausea and vomiting  . Morphine Nausea And Vomiting    Patient states that she cannot take morphine due to past experience with vomiting.   Marland Kitchen Percocet [Oxycodone-Acetaminophen] Nausea Only    Outpatient Encounter Medications as of 08/27/2022  Medication Sig  . aspirin EC 81 MG tablet Take 1 tablet (81 mg total) by mouth 2 (two) times daily. Swallow whole.  . Calcium Carbonate-Vitamin D3 600-400 MG-UNIT TABS Take 1 tablet by mouth daily.   Marland Kitchen glimepiride (AMARYL) 2 MG tablet Take 2 mg by mouth 2 (two) times daily.  Marland Kitchen levothyroxine (SYNTHROID) 88 MCG tablet Take 88 mcg by mouth daily.  . Multiple Vitamin (MULTI-VITAMIN) tablet Take 1 tablet by mouth daily.  . mupirocin ointment (BACTROBAN) 2 % Apply 1 application topically daily. Qd to wounds  . ondansetron (ZOFRAN) 4 MG tablet Take 1 tablet (4 mg total) by mouth every 8 (eight) hours as needed for up to 10 doses for nausea or vomiting.  . pantoprazole (PROTONIX) 40 MG tablet Take 1 tablet (40 mg total) by mouth daily.  . rosuvastatin (CRESTOR) 5 MG tablet Take 5 mg by mouth every other day.  . scopolamine (TRANSDERM-SCOP) 1 MG/3DAYS Place 1 patch (1.5 mg total) onto the skin every 3 (three) days.  . valsartan-hydrochlorothiazide (DIOVAN-HCT) 320-25 MG tablet Take 1 tablet by mouth daily.   No facility-administered encounter medications on file as of 08/27/2022.    Review of Systems  Vitals:   08/27/22 0903  BP: (!) 126/54  Pulse: 68  Resp: 18  Temp: (!) 97.5 F (36.4 C)  SpO2: 96%  Weight: 148 lb 9.6 oz (67.4 kg)  Height: 5\' 4"  (1.626 m)   Body mass index is 25.51 kg/m. Physical Exam  Labs reviewed: Basic Metabolic Panel: Recent Labs    08/18/22 1057 08/18/22 1847 08/21/22 0429  08/22/22 0412 08/23/22 0559  NA  --    < > 135 135 136  K  --    < > 4.1 3.4* 3.7  CL  --    < > 107 105 103  CO2  --    < > 21* 24 26  GLUCOSE  --    < > 178* 130* 170*  BUN  --    < > 11 9 7*  CREATININE  --    < >  0.46 0.44 0.49  CALCIUM  --    < > 7.9* 7.9* 7.9*  MG 2.0  --   --   --  1.9   < > = values in this interval not displayed.   Liver Function Tests: Recent Labs    08/18/22 1053 08/19/22 0419  AST 25 26  ALT 22 25  ALKPHOS 55 52  BILITOT 1.9* 2.0*  PROT 6.3* 5.7*  ALBUMIN 3.5 3.4*   No results for input(s): "LIPASE", "AMYLASE" in the last 8760 hours. No results for input(s): "AMMONIA" in the last 8760 hours. CBC: Recent Labs    08/18/22 1053 08/19/22 0419 08/21/22 0429 08/22/22 0412 08/23/22 0559  WBC 9.8   < > 6.1 5.5 5.2  NEUTROABS 7.8*  --   --   --   --   HGB 13.0   < > 9.1* 8.3* 9.6*  HCT 37.4   < > 27.5* 25.3* 28.7*  MCV 87.8   < > 92.9 91.3 91.7  PLT 188   < > 126* 135* 169   < > = values in this interval not displayed.   Cardiac Enzymes: No results for input(s): "CKTOTAL", "CKMB", "CKMBINDEX", "TROPONINI" in the last 8760 hours. BNP: Invalid input(s): "POCBNP" Lab Results  Component Value Date   HGBA1C 7.3 (H) 08/18/2022   No results found for: "TSH" Lab Results  Component Value Date   VITAMINB12 1,382 (H) 08/22/2022   No results found for: "FOLATE" Lab Results  Component Value Date   IRON 21 (L) 08/22/2022   TIBC 195 (L) 08/22/2022   FERRITIN 207 08/22/2022    Imaging and Procedures obtained prior to SNF admission: DG Hip Port Unilat With Pelvis 1V Left  Result Date: 08/19/2022 CLINICAL DATA:  Status post hip hemiarthroplasty. EXAM: DG HIP (WITH OR WITHOUT PELVIS) 1V PORT LEFT COMPARISON:  08/18/2022 FINDINGS: Patient is immediately status post left hip hemiarthroplasty. No evidence for immediate hardware complication. Gas in the soft tissues is compatible with the immediate postoperative state. IMPRESSION: Status post left hip  hemiarthroplasty without evidence for immediate hardware complication. Electronically Signed   By: Kennith Center M.D.   On: 08/19/2022 11:20   DG Chest Port 1 View  Result Date: 08/18/2022 CLINICAL DATA:  Fall. EXAM: PORTABLE CHEST 1 VIEW COMPARISON:  May 26, 2020. FINDINGS: The heart size and mediastinal contours are within normal limits. Both lungs are clear. The visualized skeletal structures are unremarkable. IMPRESSION: No active disease. Electronically Signed   By: Lupita Raider M.D.   On: 08/18/2022 11:11   DG Hip Unilat W or Wo Pelvis 2-3 Views Left  Result Date: 08/18/2022 CLINICAL DATA:  Status post fall with severe pain. EXAM: DG HIP (WITH OR WITHOUT PELVIS) 2-3V LEFT COMPARISON:  None Available. FINDINGS: There is an acute, subcapital left femoral neck fracture. Proximal displacement of the distal fracture fragments. No sign of dislocation. Right hip appears located and intact. No signs of pelvic diastasis or fracture. IMPRESSION: Acute subcapital left femoral neck fracture. Electronically Signed   By: Signa Kell M.D.   On: 08/18/2022 11:11    Assessment/Plan There are no diagnoses linked to this encounter.   Family/ staff Communication:   Labs/tests ordered:

## 2022-08-28 DIAGNOSIS — K5900 Constipation, unspecified: Secondary | ICD-10-CM | POA: Insufficient documentation

## 2022-08-28 DIAGNOSIS — K5730 Diverticulosis of large intestine without perforation or abscess without bleeding: Secondary | ICD-10-CM | POA: Insufficient documentation

## 2022-08-28 DIAGNOSIS — K625 Hemorrhage of anus and rectum: Secondary | ICD-10-CM | POA: Insufficient documentation

## 2022-09-24 LAB — CBC AND DIFFERENTIAL
HCT: 37 (ref 36–46)
Hemoglobin: 12.2 (ref 12.0–16.0)
Neutrophils Absolute: 4322
Platelets: 241 10*3/uL (ref 150–400)
WBC: 7.4

## 2022-09-24 LAB — COMPREHENSIVE METABOLIC PANEL
Calcium: 9.5 (ref 8.7–10.7)
eGFR: 89

## 2022-09-24 LAB — CBC: RBC: 4.12 (ref 3.87–5.11)

## 2022-09-24 LAB — BASIC METABOLIC PANEL
BUN: 12 (ref 4–21)
CO2: 31 — AB (ref 13–22)
Chloride: 97 — AB (ref 99–108)
Creatinine: 0.5 (ref 0.5–1.1)
Glucose: 144
Potassium: 3.8 mEq/L (ref 3.5–5.1)
Sodium: 136 — AB (ref 137–147)

## 2022-09-28 ENCOUNTER — Non-Acute Institutional Stay (SKILLED_NURSING_FACILITY): Payer: Medicare Other | Admitting: Student

## 2022-09-28 ENCOUNTER — Encounter: Payer: Self-pay | Admitting: Student

## 2022-09-28 DIAGNOSIS — D62 Acute posthemorrhagic anemia: Secondary | ICD-10-CM

## 2022-09-28 DIAGNOSIS — I10 Essential (primary) hypertension: Secondary | ICD-10-CM

## 2022-09-28 DIAGNOSIS — S72002A Fracture of unspecified part of neck of left femur, initial encounter for closed fracture: Secondary | ICD-10-CM

## 2022-09-28 DIAGNOSIS — D696 Thrombocytopenia, unspecified: Secondary | ICD-10-CM | POA: Diagnosis not present

## 2022-09-28 DIAGNOSIS — E1165 Type 2 diabetes mellitus with hyperglycemia: Secondary | ICD-10-CM

## 2022-09-28 DIAGNOSIS — E039 Hypothyroidism, unspecified: Secondary | ICD-10-CM

## 2022-09-28 NOTE — Progress Notes (Signed)
Location:  Other [place].  Nursing Home Room Number: [facility] 111A Place of Service:  SNF [phone]) Provider:  Dr. Earnestine Mealing  PCP: Kandyce Rud, MD  Patient Care Team: Kandyce Rud, MD as PCP - General (Family Medicine)  Extended Emergency Contact Information Primary Emergency Contact: Inocencio Homes [place] Home Phone: [phone] Mobile Phone: [phone] Relation: Son  Code Status:  Full Code.  Goals of care: Advanced Directive information    09/28/2022    9:18 AM  Advanced Directives  Does Patient Have a Medical Advance Directive? No  Does patient want to make changes to medical advance directive? No - Patient declined     Chief Complaint  Patient presents with   Medical Management of Chronic Issues    Medical Management of Chronic Issues.     HPI:  Pt is a 89 y.o. female seen today for medical management of chronic diseases.    Patient is doing well. Walking long distances with therapy. She is doing well, but hopes to stay until she is closer to her prior baseline. Eating well. Weight stable. Urinating well. Normal bowel movements. Unclear when she will be able to discharge home.   Nursing without concerns at this time.   Past Medical History:  Diagnosis Date   Actinic keratosis    Diabetes mellitus without complication (HCC)    Hx of dysplastic nevus 06/29/2008   R distal lateral thigh, mild to moderate   Hypertension    Squamous cell carcinoma of skin 05/07/2016   R dorsum foot   Squamous cell carcinoma of skin 12/24/2013   SCC IS R dorsal hand   Squamous cell carcinoma of skin 10/10/2020   right medial ankle (in situ) - EDC 11/21/2020   Squamous cell carcinoma of skin 10/10/2020   left dorsum 2nd toe (in situ) - EDC 11/21/2020   Squamous cell carcinoma of skin 11/21/2020   L hand dorsum - [facility] ED&C   Past Surgical History:  Procedure Laterality Date   BACK SURGERY     HIP ARTHROPLASTY Left 08/19/2022   Procedure:  ARTHROPLASTY BIPOLAR HIP (HEMIARTHROPLASTY);  Surgeon: Deeann Saint, MD;  Location: [facility] ORS;  Service: Orthopedics;  Laterality: Left;    Allergies  Allergen Reactions   Metformin Diarrhea   Codeine     REACTION: nausea and vomiting   Morphine Nausea And Vomiting    Patient states that she cannot take morphine due to past experience with vomiting.    Percocet [Oxycodone-Acetaminophen] Nausea Only    Outpatient Encounter Medications as of 09/28/2022  Medication Sig   acetaminophen (TYLENOL) 500 MG tablet Take 1,000 mg by mouth every 8 (eight) hours as needed.   aspirin EC 81 MG tablet Take 1 tablet (81 mg total) by mouth 2 (two) times daily. Swallow whole.   Calcium Carbonate-Vitamin D3 600-400 MG-UNIT TABS Take 1 tablet by mouth daily.    glimepiride (AMARYL) 2 MG tablet Take 2 mg by mouth 2 (two) times daily.   levothyroxine (SYNTHROID) 88 MCG tablet Take 88 mcg by mouth daily.   Multiple Vitamin (MULTI-VITAMIN) tablet Take 1 tablet by mouth daily.   rosuvastatin (CRESTOR) 5 MG tablet Take 5 mg by mouth every other day.   senna (SENOKOT) 8.6 MG TABS tablet Take 2 tablets by mouth at bedtime.   valsartan-hydrochlorothiazide (DIOVAN-HCT) 320-25 MG tablet Take 1 tablet by mouth daily.   [DISCONTINUED] mupirocin ointment (BACTROBAN) 2 % Apply 1 application topically daily. Qd to wounds   [DISCONTINUED] ondansetron (ZOFRAN)  4 MG tablet Take 1 tablet (4 mg total) by mouth every 8 (eight) hours as needed for up to 10 doses for nausea or vomiting.   [DISCONTINUED] pantoprazole (PROTONIX) 40 MG tablet Take 1 tablet (40 mg total) by mouth daily.   [DISCONTINUED] scopolamine (TRANSDERM-SCOP) 1 MG/3DAYS Place 1 patch (1.5 mg total) onto the skin every 3 (three) days.   No facility-administered encounter medications on file as of 09/28/2022.    Review of Systems  Immunization History  Administered Date(s) Administered   Influenza Whole 02/26/2006   Influenza-Unspecified 12/05/2012,  01/25/2014, 01/31/2015, 12/28/2015, 01/07/2017, 02/24/2018, 11/05/2019, 11/27/2021   Moderna Sars-Covid-2 Vaccination 03/11/2019, 04/08/2019, 12/21/2019, 05/26/2020   Pneumococcal Conjugate-13 02/01/2015   Pneumococcal Polysaccharide-23 07/13/2013   Td 02/26/1993   Td (Adult), 2 Lf Tetanus Toxid, Preservative Free 02/26/1993   Pertinent  Health Maintenance Due  Topic Date Due   FOOT EXAM  Never done   OPHTHALMOLOGY EXAM  Never done   DEXA SCAN  Never done   INFLUENZA VACCINE  09/27/2022   HEMOGLOBIN A1C  02/17/2023      08/01/2018   12:12 AM 08/01/2018    8:00 AM 08/01/2018    7:48 PM 08/02/2018    9:00 AM 05/26/2020    9:03 PM  Fall Risk  (RETIRED) Patient Fall Risk Level High fall risk High fall risk High fall risk High fall risk Low fall risk   Functional Status Survey:    Vitals:   09/28/22 0841  BP: 120/68  Pulse: 80  Resp: 18  Temp: (!) 97.4 F (36.3 C)  SpO2: 93%  Weight: 136 lb 12.8 oz (62.1 kg)  Height: 5\' 4"  (1.626 m)   Body mass index is 23.48 kg/m. Physical Exam Vitals reviewed.  Constitutional:      Appearance: Normal appearance.  Cardiovascular:     Rate and Rhythm: Normal rate and regular rhythm.     Pulses: Normal pulses.  Pulmonary:     Effort: Pulmonary effort is normal.     Breath sounds: Normal breath sounds.  Abdominal:     General: Abdomen is flat.  Skin:    General: Skin is warm and dry.  Neurological:     General: No focal deficit present.     Mental Status: She is alert and oriented to person, place, and time. Mental status is at baseline.     Labs reviewed: Recent Labs    08/18/22 1057 08/18/22 1847 08/21/22 0429 08/22/22 0412 08/23/22 0559 09/24/22 0000  NA  --    < > 135 135 136 136*  K  --    < > 4.1 3.4* 3.7 3.8  CL  --    < > 107 105 103 97*  CO2  --    < > 21* 24 26 31*  GLUCOSE  --    < > 178* 130* 170*  --   BUN  --    < > 11 9 7* 12  CREATININE  --    < > 0.46 0.44 0.49 0.5  CALCIUM  --    < > 7.9* 7.9* 7.9* 9.5   MG 2.0  --   --   --  1.9  --    < > = values in this interval not displayed.   Recent Labs    08/18/22 1053 08/19/22 0419  AST 25 26  ALT 22 25  ALKPHOS 55 52  BILITOT 1.9* 2.0*  PROT 6.3* 5.7*  ALBUMIN 3.5 3.4*   Recent Labs  08/18/22 1053 08/19/22 0419 08/21/22 0429 08/22/22 0412 08/23/22 0559 09/24/22 0000  WBC 9.8   < > 6.1 5.5 5.2 7.4  NEUTROABS 7.8*  --   --   --   --  4,322.00  HGB 13.0   < > 9.1* 8.3* 9.6* 12.2  HCT 37.4   < > 27.5* 25.3* 28.7* 37  MCV 87.8   < > 92.9 91.3 91.7  --   PLT 188   < > 126* 135* 169 241   < > = values in this interval not displayed.   No results found for: "TSH" Lab Results  Component Value Date   HGBA1C 7.3 (H) 08/18/2022   No results found for: "CHOL", "HDL", "LDLCALC", "LDLDIRECT", "TRIG", "CHOLHDL"  Significant Diagnostic Results in last 30 days:  No results found.  Assessment/Plan Closed fracture of left hip, initial encounter (HCC)  Thrombocytopenia (HCC)  Acute postoperative anemia due to expected blood loss  Hypertension, unspecified type  Uncontrolled type 2 diabetes mellitus with hyperglycemia, without long-term current use of insulin (HCC)  Acquired hypothyroidism Patient denies pain. DOI 6/22. Followed by ortho. Most recent hgb within goal range. BP well-controlled. Platelets within goal range at this time. A1c at goal at 7.3. No medication changes, continue all meds as previously prescribed.   Family/ staff Communication: nursing  Labs/tests ordered:  none

## 2022-10-18 ENCOUNTER — Non-Acute Institutional Stay (SKILLED_NURSING_FACILITY): Payer: Medicare Other | Admitting: Nurse Practitioner

## 2022-10-18 ENCOUNTER — Encounter: Payer: Self-pay | Admitting: Nurse Practitioner

## 2022-10-18 ENCOUNTER — Other Ambulatory Visit: Payer: Self-pay | Admitting: Nurse Practitioner

## 2022-10-18 DIAGNOSIS — E039 Hypothyroidism, unspecified: Secondary | ICD-10-CM

## 2022-10-18 DIAGNOSIS — E1169 Type 2 diabetes mellitus with other specified complication: Secondary | ICD-10-CM

## 2022-10-18 DIAGNOSIS — S72002D Fracture of unspecified part of neck of left femur, subsequent encounter for closed fracture with routine healing: Secondary | ICD-10-CM | POA: Diagnosis not present

## 2022-10-18 DIAGNOSIS — I10 Essential (primary) hypertension: Secondary | ICD-10-CM

## 2022-10-18 DIAGNOSIS — D62 Acute posthemorrhagic anemia: Secondary | ICD-10-CM

## 2022-10-18 NOTE — Progress Notes (Signed)
Location:  Other [place].  Nursing Home Room Number: [facility] [address]A Place of Service:  SNF ([phone]) Abbey Chatters, NP  PCP: Kandyce Rud, MD  Patient Care Team: Kandyce Rud, MD as PCP - General (Family Medicine)  Extended Emergency Contact Information Primary Emergency Contact: Inocencio Homes [place] Home Phone: [phone] Mobile Phone: [phone] Relation: Son  Goals of care: Advanced Directive information    10/18/2022    1:27 PM  Advanced Directives  Does Patient Have a Medical Advance Directive? No  Does patient want to make changes to medical advance directive? No - Patient declined     Chief Complaint  Patient presents with   Discharge Note    Discharge    HPI:  Pt is a 89 y.o. female seen today for discharge home.  Pt has been at [facility] since June 28th after fall at home with hip fracture  She is s/p left hip hemiarthroplasty done on 08/19/22 Pain has been well controlled, not needing medication.  She lives alone but son lives on the property.  Anemia has improved on labs last month.  No concerns over going home.      Past Medical History:  Diagnosis Date   Actinic keratosis    Diabetes mellitus without complication (HCC)    Hx of dysplastic nevus 06/29/2008   R distal lateral thigh, mild to moderate   Hypertension    Squamous cell carcinoma of skin 05/07/2016   R dorsum foot   Squamous cell carcinoma of skin 12/24/2013   SCC IS R dorsal hand   Squamous cell carcinoma of skin 10/10/2020   right medial ankle (in situ) - EDC 11/21/2020   Squamous cell carcinoma of skin 10/10/2020   left dorsum 2nd toe (in situ) - [facility] 11/21/2020   Squamous cell carcinoma of skin 11/21/2020   L hand dorsum - [facility] ED&C   Past Surgical History:  Procedure Laterality Date   BACK SURGERY     HIP ARTHROPLASTY Left 08/19/2022   Procedure: ARTHROPLASTY BIPOLAR HIP (HEMIARTHROPLASTY);  Surgeon: Deeann Saint, MD;  Location: [facility] ORS;   Service: Orthopedics;  Laterality: Left;    Allergies  Allergen Reactions   Metformin Diarrhea   Codeine     REACTION: nausea and vomiting   Morphine Nausea And Vomiting    Patient states that she cannot take morphine due to past experience with vomiting.    Percocet [Oxycodone-Acetaminophen] Nausea Only    Outpatient Encounter Medications as of 10/18/2022  Medication Sig   acetaminophen (TYLENOL) 500 MG tablet Take 1,000 mg by mouth every 8 (eight) hours as needed.   Calcium Carbonate-Vitamin D3 600-400 MG-UNIT TABS Take 1 tablet by mouth daily.    glimepiride (AMARYL) 2 MG tablet Take 2 mg by mouth 2 (two) times daily.   levothyroxine (SYNTHROID) 88 MCG tablet Take 88 mcg by mouth daily.   Multiple Vitamin (MULTI-VITAMIN) tablet Take 1 tablet by mouth daily.   rosuvastatin (CRESTOR) 5 MG tablet Take 5 mg by mouth every other day.   senna (SENOKOT) 8.6 MG TABS tablet Take 2 tablets by mouth at bedtime.   valsartan-hydrochlorothiazide (DIOVAN-HCT) 320-25 MG tablet Take 1 tablet by mouth daily.   Zinc Oxide (TRIPLE PASTE) 12.8 % ointment Apply 1 Application topically. Every shift.   No facility-administered encounter medications on file as of 10/18/2022.    Review of Systems  Constitutional:  Negative for activity change, appetite change, fatigue and unexpected weight change.  HENT:  Negative for congestion and  hearing loss.   Eyes: Negative.   Respiratory:  Negative for cough and shortness of breath.   Cardiovascular:  Negative for chest pain, palpitations and leg swelling.  Gastrointestinal:  Negative for abdominal pain, constipation and diarrhea.  Genitourinary:  Negative for difficulty urinating and dysuria.  Musculoskeletal:  Negative for arthralgias and myalgias.  Skin:  Negative for color change and wound.  Neurological:  Negative for dizziness and weakness.  Psychiatric/Behavioral:  Negative for agitation, behavioral problems and confusion.      Immunization History   Administered Date(s) Administered   Influenza Whole 02/26/2006   Influenza-Unspecified 12/05/2012, 01/25/2014, 01/31/2015, 12/28/2015, 01/07/2017, 02/24/2018, 11/05/2019, 11/27/2021   Moderna Sars-Covid-2 Vaccination 03/11/2019, 04/08/2019, 12/21/2019, 05/26/2020   Pneumococcal Conjugate-13 02/01/2015   Pneumococcal Polysaccharide-23 07/13/2013   Td 02/26/1993   Td (Adult), 2 Lf Tetanus Toxid, Preservative Free 02/26/1993   Pertinent  Health Maintenance Due  Topic Date Due   FOOT EXAM  Never done   OPHTHALMOLOGY EXAM  Never done   DEXA SCAN  Never done   INFLUENZA VACCINE  09/27/2022   HEMOGLOBIN A1C  02/17/2023      08/01/2018   12:12 AM 08/01/2018    8:00 AM 08/01/2018    7:48 PM 08/02/2018    9:00 AM 05/26/2020    9:03 PM  Fall Risk  (RETIRED) Patient Fall Risk Level High fall risk High fall risk High fall risk High fall risk Low fall risk   Functional Status Survey:    Vitals:   10/18/22 1321  BP: 128/76  Pulse: 78  Resp: 16  Temp: 97.8 F (36.6 C)  SpO2: 95%  Weight: 140 lb 6.4 oz (63.7 kg)  Height: 5\' 4"  (1.626 m)   Body mass index is 24.1 kg/m. Physical Exam Constitutional:      General: She is not in acute distress.    Appearance: She is well-developed. She is not diaphoretic.  HENT:     Head: Normocephalic and atraumatic.     Mouth/Throat:     Pharynx: No oropharyngeal exudate.  Eyes:     Conjunctiva/sclera: Conjunctivae normal.     Pupils: Pupils are equal, round, and reactive to light.  Cardiovascular:     Rate and Rhythm: Normal rate and regular rhythm.     Heart sounds: Normal heart sounds.  Pulmonary:     Effort: Pulmonary effort is normal.     Breath sounds: Normal breath sounds.  Abdominal:     General: Bowel sounds are normal.     Palpations: Abdomen is soft.  Musculoskeletal:     Cervical back: Normal range of motion and neck supple.     Right lower leg: No edema.     Left lower leg: No edema.  Skin:    General: Skin is warm and dry.   Neurological:     Mental Status: She is alert and oriented to person, place, and time.     Motor: No weakness.     Gait: Gait normal.  Psychiatric:        Mood and Affect: Mood normal.    Labs reviewed: Recent Labs    08/18/22 1057 08/18/22 1847 08/21/22 0429 08/22/22 0412 08/23/22 0559 09/24/22 0000  NA  --    < > 135 135 136 136*  K  --    < > 4.1 3.4* 3.7 3.8  CL  --    < > 107 105 103 97*  CO2  --    < > 21* 24 26 31*  GLUCOSE  --    < >  178* 130* 170*  --   BUN  --    < > 11 9 7* 12  CREATININE  --    < > 0.46 0.44 0.49 0.5  CALCIUM  --    < > 7.9* 7.9* 7.9* 9.5  MG 2.0  --   --   --  1.9  --    < > = values in this interval not displayed.   Recent Labs    08/18/22 1053 08/19/22 0419  AST 25 26  ALT 22 25  ALKPHOS 55 52  BILITOT 1.9* 2.0*  PROT 6.3* 5.7*  ALBUMIN 3.5 3.4*   Recent Labs    08/18/22 1053 08/19/22 0419 08/21/22 0429 08/22/22 0412 08/23/22 0559 09/24/22 0000  WBC 9.8   < > 6.1 5.5 5.2 7.4  NEUTROABS 7.8*  --   --   --   --  4,322.00  HGB 13.0   < > 9.1* 8.3* 9.6* 12.2  HCT 37.4   < > 27.5* 25.3* 28.7* 37  MCV 87.8   < > 92.9 91.3 91.7  --   PLT 188   < > 126* 135* 169 241   < > = values in this interval not displayed.   No results found for: "TSH" Lab Results  Component Value Date   HGBA1C 7.3 (H) 08/18/2022   No results found for: "CHOL", "HDL", "LDLCALC", "LDLDIRECT", "TRIG", "CHOLHDL"  Significant Diagnostic Results in last 30 days:  No results found.  Assessment/Plan 1. Closed fracture of left hip with routine healing, subsequent encounter Healing well. Has follow up with orthopedics. Getting around well with walker.   2. Hypertension, unspecified type -Blood pressure well controlled, goal bp <140/90 Continue current medications and dietary modifications follow metabolic panel  3. Acquired hypothyroidism Continues on synthroid   4. Acute postoperative anemia due to expected blood loss Improved on recent lab. Could  not tolerate iron  5. Type 2 diabetes mellitus with other specified complication, without long-term current use of insulin (HCC) Works on diet to control blood sugar, continues on amaryl 2 mg daily PCP will need to follow up.   pt is stable for discharge-will need PT/OT/ per home health. DME needed includes shoulder chair and walker. She reports she does not need any Rx refills, already [facility] Rx [facility]. PCP within 2 weeks.    Janene Harvey. Biagio [facility] [phone]

## 2022-12-13 ENCOUNTER — Ambulatory Visit: Payer: Medicare Other | Admitting: Dermatology

## 2023-01-03 ENCOUNTER — Ambulatory Visit (INDEPENDENT_AMBULATORY_CARE_PROVIDER_SITE_OTHER): Payer: Medicare Other | Admitting: Dermatology

## 2023-01-03 ENCOUNTER — Encounter: Payer: Self-pay | Admitting: Dermatology

## 2023-01-03 DIAGNOSIS — L84 Corns and callosities: Secondary | ICD-10-CM

## 2023-01-03 DIAGNOSIS — W908XXA Exposure to other nonionizing radiation, initial encounter: Secondary | ICD-10-CM | POA: Diagnosis not present

## 2023-01-03 DIAGNOSIS — L578 Other skin changes due to chronic exposure to nonionizing radiation: Secondary | ICD-10-CM | POA: Diagnosis not present

## 2023-01-03 MED ORDER — MUPIROCIN 2 % EX OINT: TOPICAL_OINTMENT | CUTANEOUS | 2 refills | Status: AC

## 2023-01-03 NOTE — Progress Notes (Signed)
   Follow-Up Visit   Subjective  Wanda Baird is a 88 y.o. female who presents for the following: Spot on bottom of left foot. Had pedicure 3-4 weeks ago. Technician picked at the area. Has not healed. Patient concerned as she is diabetic.  Has been applying mupirocin to area.   The patient has spots, moles and lesions to be evaluated, some may be new or changing and the patient may have concern these could be cancer.    The following portions of the chart were reviewed this encounter and updated as appropriate: medications, allergies, medical history  Review of Systems:  No other skin or systemic complaints except as noted in HPI or Assessment and Plan.  Objective  Well appearing patient in no apparent distress; mood and affect are within normal limits.  A focused examination was performed of the following areas: Left foot  Relevant physical exam findings are noted in the Assessment and Plan.  Left Plantar Hallux Toe           Assessment & Plan   Callus Symptomatic Pt desires treatment Left Plantar Hallux Toe Hyperkeratotic plaque with crust  Debridement performed Post debridement the area is much smoother No evidence of infection  ACTINIC DAMAGE - chronic, secondary to cumulative UV radiation exposure/sun exposure over time - diffuse scaly erythematous macules with underlying dyspigmentation - Recommend daily broad spectrum sunscreen SPF 30+ to sun-exposed areas, reapply every 2 hours as needed.  - Recommend staying in the shade or wearing long sleeves, sun glasses (UVA+UVB protection) and wide brim hats (4-inch brim around the entire circumference of the hat). - Call for new or changing lesions.  Return if symptoms worsen or fail to improve.  I, Lawson Radar, CMA, am acting as scribe for Armida Sans, MD.   Documentation: I have reviewed the above documentation for accuracy and completeness, and I agree with the above.  Armida Sans,  MD

## 2023-01-03 NOTE — Patient Instructions (Signed)
Apply mupirocin once or twice daily to toe.    Due to recent changes in healthcare laws, you may see results of your pathology and/or laboratory studies on MyChart before the doctors have had a chance to review them. We understand that in some cases there may be results that are confusing or concerning to you. Please understand that not all results are received at the same time and often the doctors may need to interpret multiple results in order to provide you with the best plan of care or course of treatment. Therefore, we ask that you please give [place] 2 business days to thoroughly review all your results before contacting the office for clarification. Should we see a critical lab result, you will be contacted sooner.   If You Need Anything After Your Visit  If you have any questions or concerns for your doctor, please call our main line at [phone] and press option 4 to reach your doctor's medical assistant. If no one answers, please leave a voicemail as directed and we will return your call as soon as possible. Messages left after 4 pm will be answered the following business day.   You may also send [place] a message via MyChart. We typically respond to MyChart messages within 1-2 business days.  For prescription refills, please ask your pharmacy to contact our office. Our fax number is [phone].  If you have an urgent issue when the clinic is closed that cannot wait until the next business day, you can page your doctor at the number below.    Please note that while we do our best to be available for urgent issues outside of office hours, we are not available 24/7.   If you have an urgent issue and are unable to reach [place], you may choose to seek medical care at your doctor's office, retail clinic, urgent care center, or emergency room.  If you have a medical emergency, please immediately call 911 or go to the emergency department.  Pager Numbers  - Dr. Gwen Pounds: [phone]  - Dr.  Roseanne Reno: [phone]  - Dr. Katrinka Blazing: [phone]   In the event of inclement weather, please call our main line at [phone] for an update on the status of any delays or closures.  Dermatology Medication Tips: Please keep the boxes that topical medications come in in order to help keep track of the instructions about where and how to use these. Pharmacies typically print the medication instructions only on the boxes and not directly on the medication tubes.   If your medication is too expensive, please contact our office at [phone] option 4 or send [place] a message through MyChart.   We are unable to tell what your co-pay for medications will be in advance as this is different depending on your insurance coverage. However, we may be able to find a substitute medication at lower cost or fill out paperwork to get insurance to cover a needed medication.   If a prior authorization is required to get your medication covered by your insurance company, please allow [place] 1-2 business days to complete this process.  Drug prices often vary depending on where the prescription is filled and some pharmacies may offer cheaper prices.  The website www.goodrx.com contains coupons for medications through different pharmacies. The prices here do not account for what the cost may be with help from insurance (it may be cheaper with your insurance), but the website can give you the price if you did not use any insurance.  -  You can print the associated coupon and take it with your prescription to the pharmacy.  - You may also stop by our office during regular business hours and pick up a GoodRx coupon card.  - If you need your prescription sent electronically to a different pharmacy, notify our office through [facility] or by phone at [phone] option 4.     Si Usted Necesita Algo Despus de Su Visita  Tambin puede enviarnos un mensaje a travs de Clinical cytogeneticist. Por lo general respondemos a los  mensajes de MyChart en el transcurso de 1 a 2 das hbiles.  Para renovar recetas, por favor pida a su farmacia que se ponga en contacto con nuestra oficina. Annie Sable de fax es [place] [phone].  Si tiene un asunto urgente cuando la clnica est cerrada y que no puede esperar hasta el siguiente da hbil, puede llamar/localizar a su doctor(a) al nmero que aparece a continuacin.   Por favor, tenga en cuenta que aunque hacemos todo lo posible para estar disponibles para asuntos urgentes fuera del horario de [place], no estamos disponibles las 24 horas del da, los 7 [address]  [address] de la [place].   Si tiene un problema urgente y no puede comunicarse con nosotros, puede optar por buscar atencin mdica  en el consultorio de su doctor(a), en una clnica privada, en un centro de atencin urgente o en una sala de emergencias.  Si tiene Engineer, drilling, por favor llame inmediatamente al 911 o vaya a la sala de emergencias.  Nmeros de bper  - Dr. Gwen Pounds: [phone]  - Dra. Roseanne Reno: [phone]  - Dr. Katrinka Blazing: [phone]   En caso de inclemencias del tiempo, por favor llame a Lacy Duverney principal al [phone] para una actualizacin sobre el [place] de cualquier retraso o cierre.  Consejos para la medicacin en dermatologa: Por favor, guarde las cajas en las que vienen los medicamentos de uso tpico para ayudarle a seguir las instrucciones sobre dnde y cmo usarlos. Las farmacias generalmente imprimen las instrucciones del medicamento slo en las cajas y no directamente en los tubos del [place].   Si su medicamento es muy caro, por favor, pngase en contacto con Rolm Gala llamando al [phone] y presione la opcin 4 o envenos un mensaje a travs de Clinical cytogeneticist.   No podemos decirle cul ser su copago por los medicamentos por adelantado ya que esto es diferente dependiendo de la cobertura de su seguro. Sin embargo, es posible que podamos encontrar un medicamento sustituto a  Audiological scientist un formulario para que el seguro cubra el medicamento que se considera necesario.   Si se requiere una autorizacin previa para que su compaa de seguros [place] su medicamento, por favor permtanos de 1 a 2 das hbiles para completar [address]  Los precios de los medicamentos varan con frecuencia dependiendo del Environmental consultant de dnde se surte la receta y alguna farmacias pueden ofrecer precios ms baratos.  El sitio web www.goodrx.com tiene cupones para medicamentos de Health and safety inspector. Los precios aqu no tienen en cuenta lo que podra costar con la ayuda del seguro (puede ser ms barato con su seguro), pero el sitio web puede darle el precio si no utiliz Tourist information centre manager.  - Puede imprimir el cupn correspondiente y llevarlo con su receta a la farmacia.  - Tambin puede pasar por nuestra oficina durante el horario de atencin regular y Education officer, museum una tarjeta de cupones de GoodRx.  - Si necesita que su receta se enve electrnicamente a [place],  informe a nuestra oficina a travs de MyChart de [place] o por telfono llamando al [phone] y presione la opcin 4.

## 2023-01-24 IMAGING — CR DG CHEST 2V
1 series · 2 of 2 positions shown · non-contrast
Comparison: 07/31/2018

CLINICAL DATA: Emesis, diarrhea

EXAM:
CHEST - 2 VIEW

[Series 1: dg chest 2 view · 0.14mm/px · 2 of 2 slices shown]
[im 1/2]
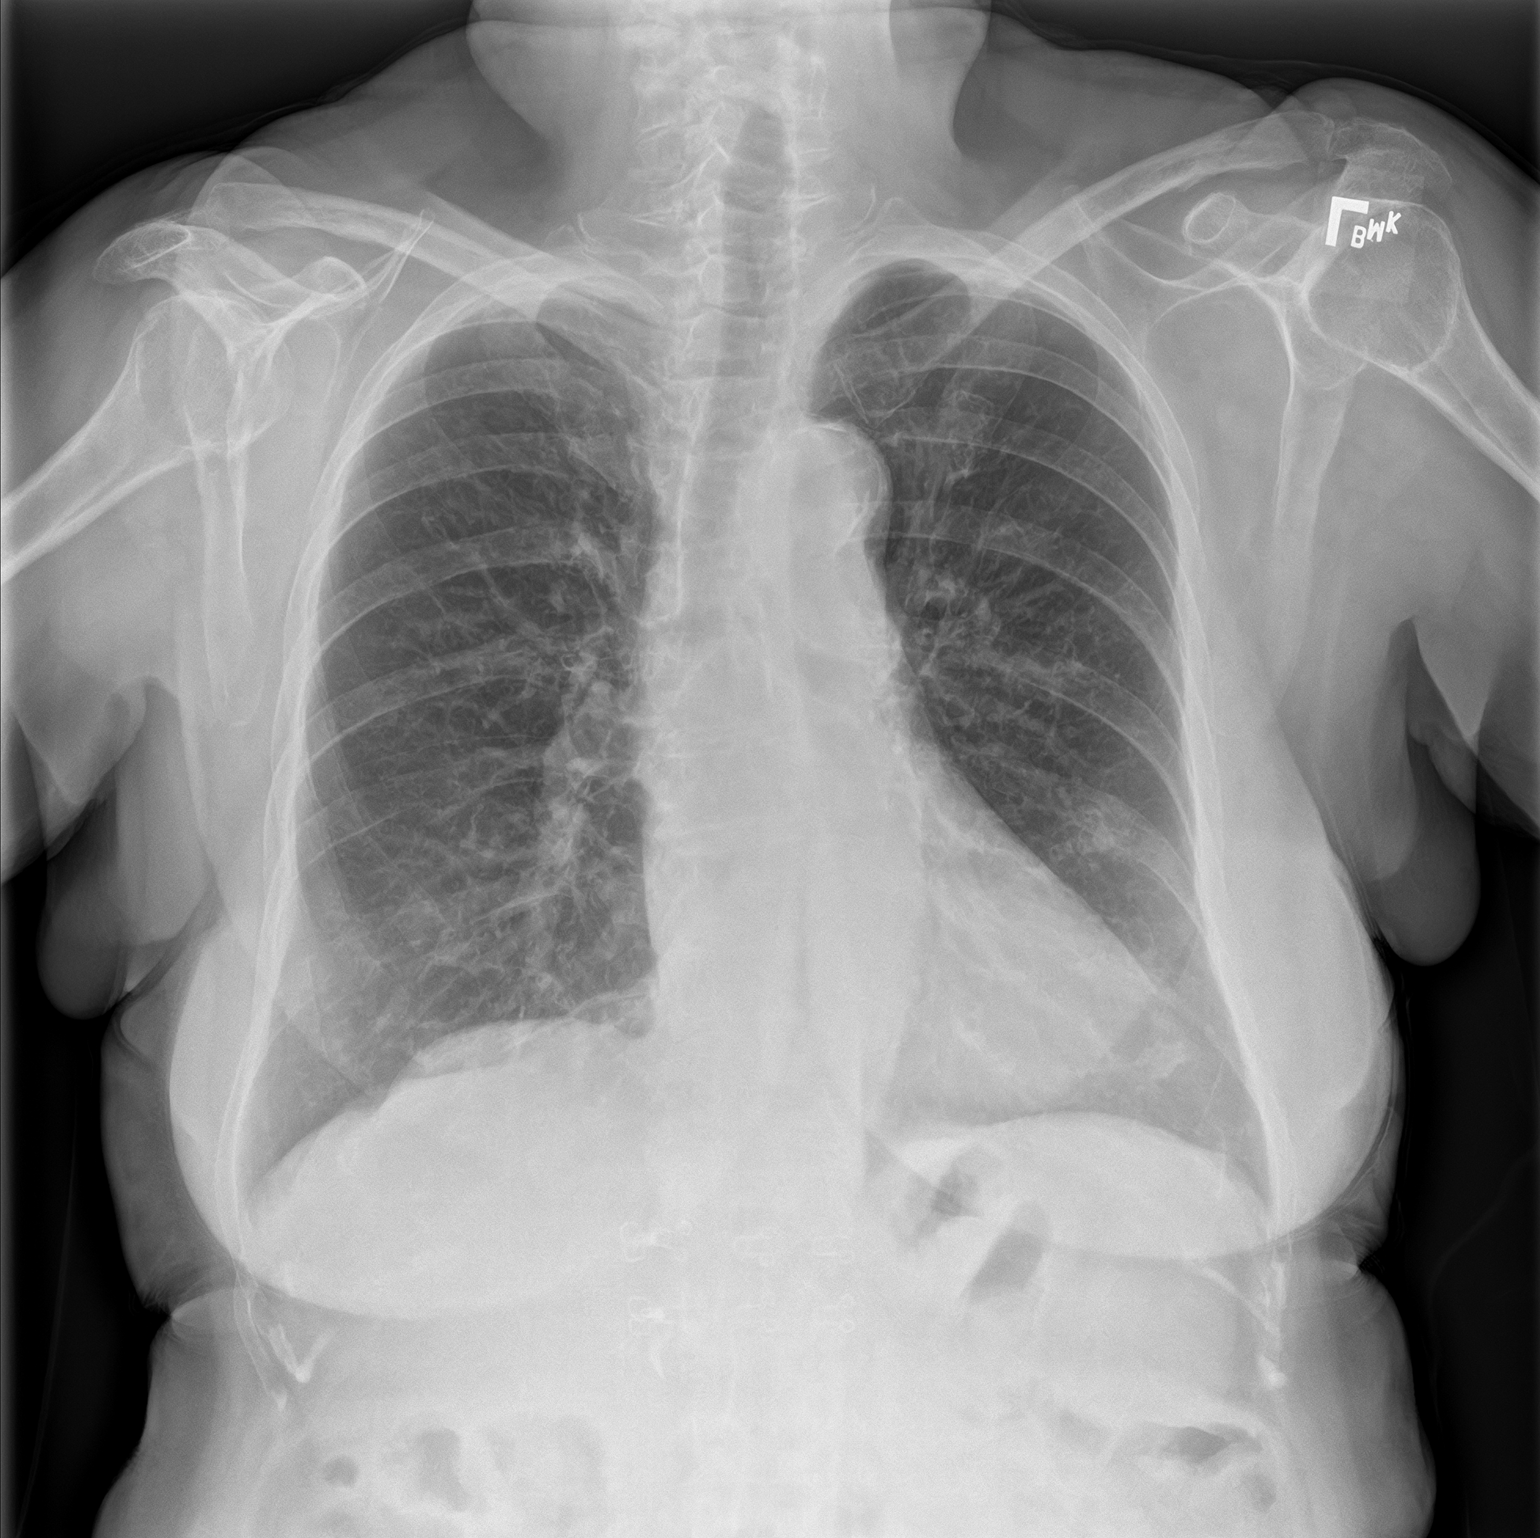
[im 2/2]
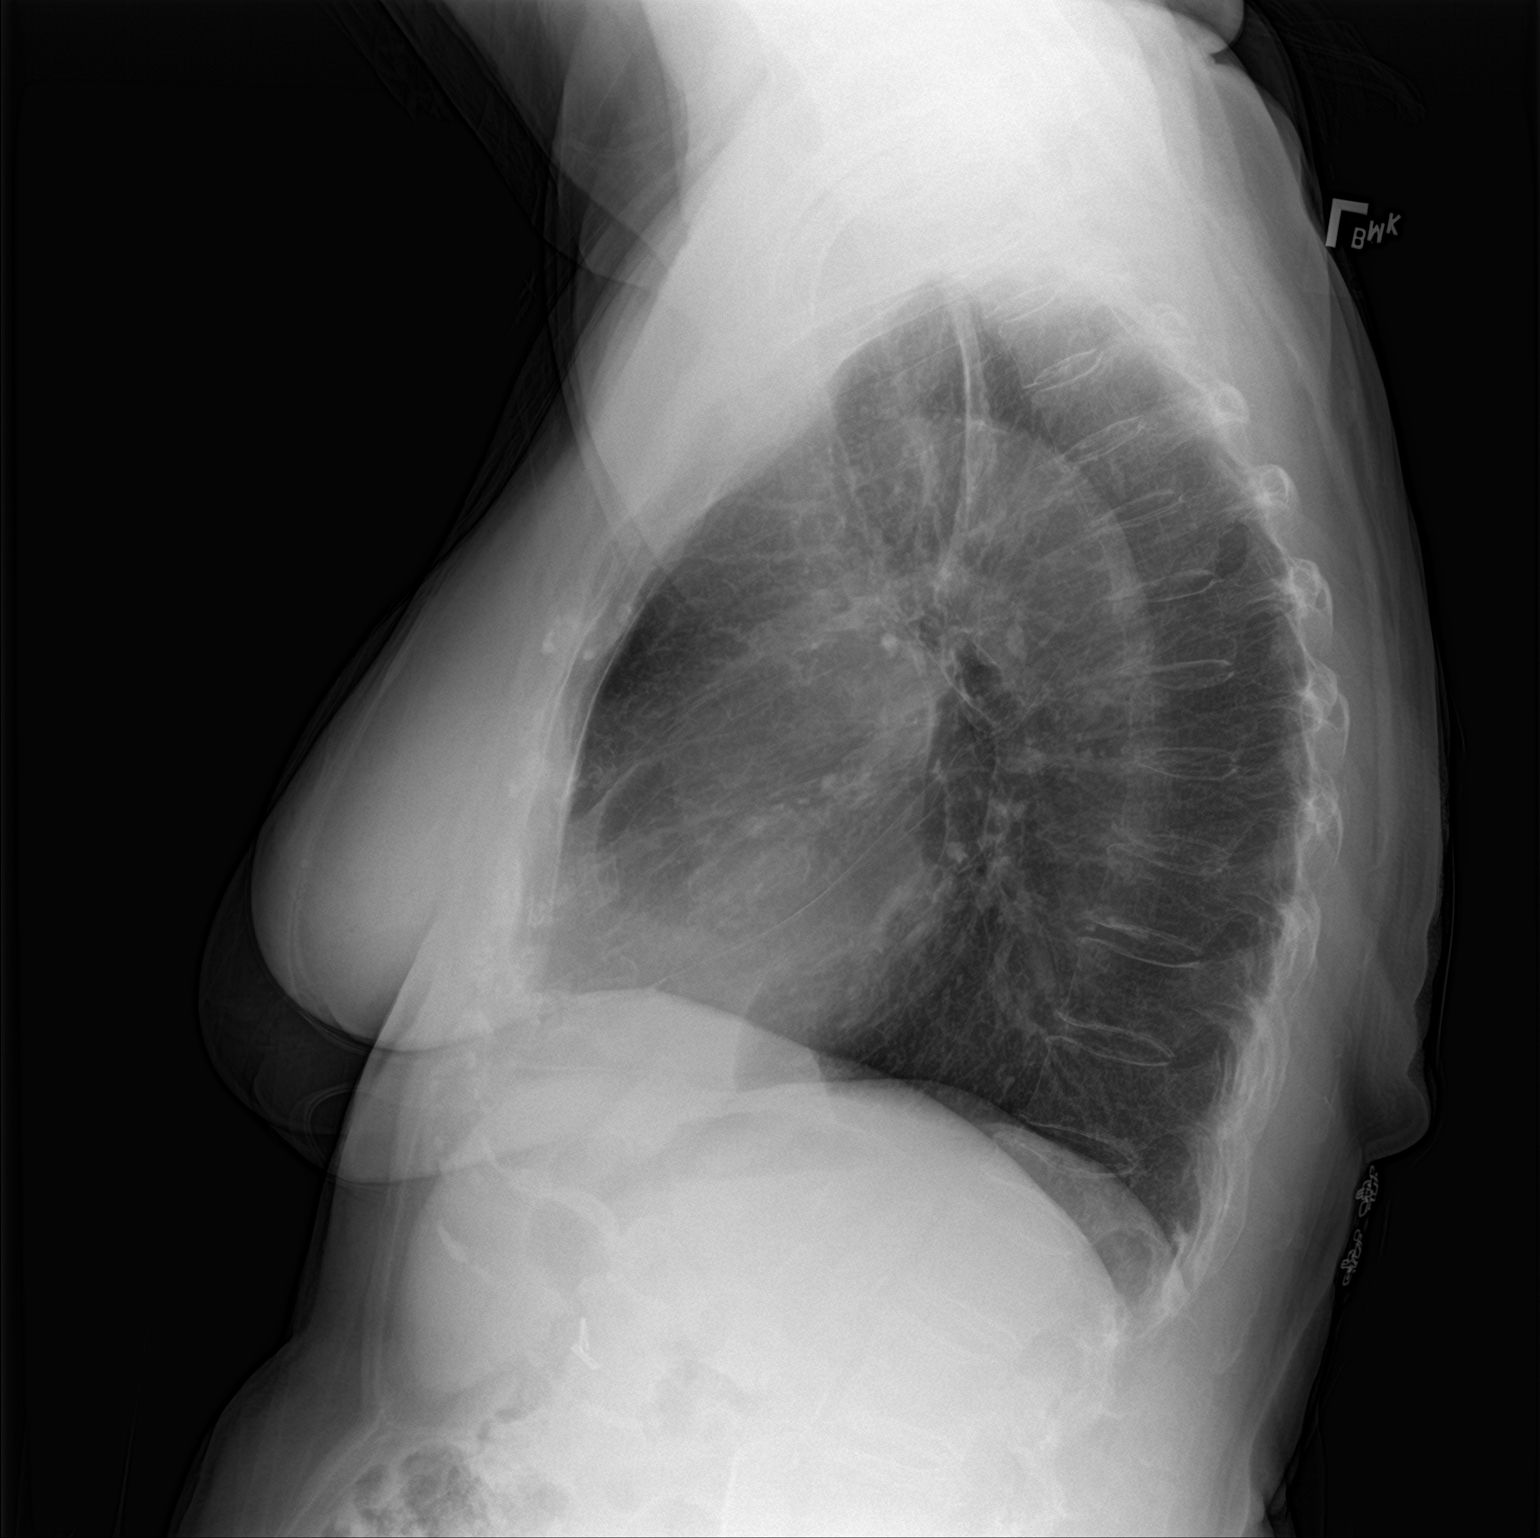

[2 of 2 positions shown; findings below may reference images not displayed]

FINDINGS: Frontal and lateral views of the chest demonstrate an unremarkable
cardiac silhouette. Chronic background scarring without airspace
disease, effusion, or pneumothorax. Chronic T12 wedge compression
deformity. No acute bony abnormalities.
IMPRESSION: 1. No acute intrathoracic process.

## 2023-06-10 DIAGNOSIS — L570 Actinic keratosis: Secondary | ICD-10-CM

## 2023-06-10 DIAGNOSIS — D099 Carcinoma in situ, unspecified: Secondary | ICD-10-CM

## 2023-06-10 HISTORY — DX: Carcinoma in situ, unspecified: D09.9

## 2023-06-10 HISTORY — DX: Actinic keratosis: L57.0

## 2023-06-13 ENCOUNTER — Ambulatory Visit: Admitting: Dermatology

## 2023-06-13 ENCOUNTER — Encounter: Payer: Self-pay | Admitting: Dermatology

## 2023-06-13 DIAGNOSIS — Z8589 Personal history of malignant neoplasm of other organs and systems: Secondary | ICD-10-CM

## 2023-06-13 DIAGNOSIS — Z7189 Other specified counseling: Secondary | ICD-10-CM

## 2023-06-13 DIAGNOSIS — L578 Other skin changes due to chronic exposure to nonionizing radiation: Secondary | ICD-10-CM | POA: Diagnosis not present

## 2023-06-13 DIAGNOSIS — D229 Melanocytic nevi, unspecified: Secondary | ICD-10-CM

## 2023-06-13 DIAGNOSIS — D0472 Carcinoma in situ of skin of left lower limb, including hip: Secondary | ICD-10-CM

## 2023-06-13 DIAGNOSIS — L570 Actinic keratosis: Secondary | ICD-10-CM

## 2023-06-13 DIAGNOSIS — D492 Neoplasm of unspecified behavior of bone, soft tissue, and skin: Secondary | ICD-10-CM | POA: Diagnosis not present

## 2023-06-13 DIAGNOSIS — D692 Other nonthrombocytopenic purpura: Secondary | ICD-10-CM

## 2023-06-13 DIAGNOSIS — Z79899 Other long term (current) drug therapy: Secondary | ICD-10-CM

## 2023-06-13 DIAGNOSIS — D1801 Hemangioma of skin and subcutaneous tissue: Secondary | ICD-10-CM

## 2023-06-13 DIAGNOSIS — Z86018 Personal history of other benign neoplasm: Secondary | ICD-10-CM

## 2023-06-13 DIAGNOSIS — L720 Epidermal cyst: Secondary | ICD-10-CM

## 2023-06-13 DIAGNOSIS — L820 Inflamed seborrheic keratosis: Secondary | ICD-10-CM | POA: Diagnosis not present

## 2023-06-13 DIAGNOSIS — L814 Other melanin hyperpigmentation: Secondary | ICD-10-CM

## 2023-06-13 DIAGNOSIS — Z1283 Encounter for screening for malignant neoplasm of skin: Secondary | ICD-10-CM | POA: Diagnosis not present

## 2023-06-13 DIAGNOSIS — W908XXA Exposure to other nonionizing radiation, initial encounter: Secondary | ICD-10-CM

## 2023-06-13 DIAGNOSIS — L729 Follicular cyst of the skin and subcutaneous tissue, unspecified: Secondary | ICD-10-CM

## 2023-06-13 DIAGNOSIS — L821 Other seborrheic keratosis: Secondary | ICD-10-CM

## 2023-06-13 MED ORDER — MUPIROCIN 2 % EX OINT: TOPICAL_OINTMENT | CUTANEOUS | 6 refills | Status: AC

## 2023-06-13 MED ORDER — DOXYCYCLINE HYCLATE 100 MG PO TABS: ORAL_TABLET | ORAL | 1 refills | Status: AC

## 2023-06-13 NOTE — Progress Notes (Addendum)
 Follow-Up Visit   Subjective  Wanda Baird is a 89 y.o. female who presents for the following: Skin Cancer Screening and Full Body Skin Exam, hx of SCC, hx of Dysplastic nevus.   The patient presents for Total-Body Skin Exam (TBSE) for skin cancer screening and mole check. The patient has spots, moles and lesions to be evaluated, some may be new or changing and the patient may have concern these could be cancer.  The following portions of the chart were reviewed this encounter and updated as appropriate: medications, allergies, medical history  Review of Systems:  No other skin or systemic complaints except as noted in HPI or Assessment and Plan.  Objective  Well appearing patient in no apparent distress; mood and affect are within normal limits.  A full examination was performed including scalp, head, eyes, ears, nose, lips, neck, chest, axillae, abdomen, back, buttocks, bilateral upper extremities, bilateral lower extremities, hands, feet, fingers, toes, fingernails, and toenails. All findings within normal limits unless otherwise noted below.   Relevant physical exam findings are noted in the Assessment and Plan.  left nasal tip x 1, right forearm x 1 (2) Erythematous thin papules/macules with gritty scale.  left elbow 1.1 cm cystic papule  left dorsum foot 0.7 cm hyperkeratotic papule  left lateral foot 1.1 cm  hyperkeratotic papule  left dorsum medial  foot 1.1 cm  hyperkeratotic papule  left foot x 3 (3) Stuck-on, waxy, tan-brown papules and plaques -- Discussed benign etiology and prognosis.        Assessment & Plan   SKIN CANCER SCREENING PERFORMED TODAY.  LENTIGINES, SEBORRHEIC KERATOSES, HEMANGIOMAS - Benign normal skin lesions - Benign-appearing - Call for any changes  MELANOCYTIC NEVI - Tan-brown and/or pink-flesh-colored symmetric macules and papules - Benign appearing on exam today - Observation - Call clinic for new or changing moles -  Recommend daily use of broad spectrum spf 30+ sunscreen to sun-exposed areas.   Milia - tiny firm white papules - type of cyst - benign - sometimes these will clear with nightly OTC adapalene/Differin 0.1% gel or retinol. - may be extracted if symptomatic - observe  Sample of Rx Aklief gel given use once a day  HISTORY OF DYSPLASTIC NEVUS No evidence of recurrence today Recommend regular full body skin exams Recommend daily broad spectrum sunscreen SPF 30+ to sun-exposed areas, reapply every 2 hours as needed.  Call if any new or changing lesions are noted between office visits    HISTORY OF SQUAMOUS CELL CARCINOMA OF THE SKIN - No evidence of recurrence today - No lymphadenopathy - Recommend regular full body skin exams - Recommend daily broad spectrum sunscreen SPF 30+ to sun-exposed areas, reapply every 2 hours as needed.  - Call if any new or changing lesions are noted between office visits    AK (ACTINIC KERATOSIS) (2) left nasal tip x 1, right forearm x 1 (2) ACTINIC DAMAGE - chronic, secondary to cumulative UV radiation exposure/sun exposure over time - diffuse scaly erythematous macules with underlying dyspigmentation - Recommend daily broad spectrum sunscreen SPF 30+ to sun-exposed areas, reapply every 2 hours as needed.  - Recommend staying in the shade or wearing long sleeves, sun glasses (UVA+UVB protection) and wide brim hats (4-inch brim around the entire circumference of the hat). - Call for new or changing lesions.  Destruction of lesion - left nasal tip x 1, right forearm x 1 (2) Complexity: simple   Destruction method: cryotherapy   Informed consent: discussed and  consent obtained   Timeout:  patient name, date of birth, surgical site, and procedure verified Lesion destroyed using liquid nitrogen: Yes   Region frozen until ice ball extended beyond lesion: Yes   Outcome: patient tolerated procedure well with no complications   Post-procedure details: wound  care instructions given   NEOPLASM OF SKIN (4) left elbow Skin excision  Total excision diameter (cm):  1.1 Informed consent: discussed and consent obtained   Timeout: patient name, date of birth, surgical site, and procedure verified   Procedure prep:  Patient was prepped and draped in usual sterile fashion Prep type:  Povidone-iodine Anesthesia: the lesion was anesthetized in a standard fashion   Anesthetic:  1% lidocaine  w/ epinephrine  1-100,000 buffered w/ 8.4% NaHCO3 Instrument used: #15 blade   Hemostasis achieved with: pressure and electrodesiccation   Outcome: patient tolerated procedure well with no complications   Specimen 1 - Surgical pathology Differential Diagnosis: R/O SCC  Check Margins: No left dorsum foot Epidermal / dermal shaving  Lesion diameter (cm):  0.7 Informed consent: discussed and consent obtained   Timeout: patient name, date of birth, surgical site, and procedure verified   Procedure prep:  Patient was prepped and draped in usual sterile fashion Prep type:  Isopropyl alcohol Anesthesia: the lesion was anesthetized in a standard fashion   Anesthetic:  1% lidocaine  w/ epinephrine  1-100,000 buffered w/ 8.4% NaHCO3 Hemostasis achieved with: pressure, aluminum chloride and electrodesiccation   Outcome: patient tolerated procedure well   Post-procedure details: sterile dressing applied and wound care instructions given   Dressing type: bandage and petrolatum    Destruction of lesion Complexity: extensive   Destruction method: electrodesiccation and curettage   Informed consent: discussed and consent obtained   Timeout:  patient name, date of birth, surgical site, and procedure verified Procedure prep:  Patient was prepped and draped in usual sterile fashion Prep type:  Isopropyl alcohol Anesthesia: the lesion was anesthetized in a standard fashion   Anesthetic:  1% lidocaine  w/ epinephrine  1-100,000 buffered w/ 8.4% NaHCO3 Curettage performed in three  different directions: Yes   Electrodesiccation performed over the curetted area: Yes   Final wound size (cm):  1.1 Hemostasis achieved with:  pressure, aluminum chloride and electrodesiccation Outcome: patient tolerated procedure well with no complications   Post-procedure details: sterile dressing applied and wound care instructions given   Dressing type: bandage and petrolatum   Specimen 3 - Surgical pathology Differential Diagnosis: R/O SCC  Check Margins: No left lateral foot Epidermal / dermal shaving  Lesion diameter (cm):  1.1 Informed consent: discussed and consent obtained   Timeout: patient name, date of birth, surgical site, and procedure verified   Procedure prep:  Patient was prepped and draped in usual sterile fashion Prep type:  Isopropyl alcohol Anesthesia: the lesion was anesthetized in a standard fashion   Anesthetic:  1% lidocaine  w/ epinephrine  1-100,000 buffered w/ 8.4% NaHCO3 Hemostasis achieved with: pressure, aluminum chloride and electrodesiccation   Outcome: patient tolerated procedure well   Post-procedure details: sterile dressing applied and wound care instructions given   Dressing type: bandage and petrolatum    Destruction of lesion Complexity: extensive   Destruction method: electrodesiccation and curettage   Informed consent: discussed and consent obtained   Timeout:  patient name, date of birth, surgical site, and procedure verified Procedure prep:  Patient was prepped and draped in usual sterile fashion Prep type:  Isopropyl alcohol Anesthesia: the lesion was anesthetized in a standard fashion   Anesthetic:  1% lidocaine   w/ epinephrine  1-100,000 buffered w/ 8.4% NaHCO3 Curettage performed in three different directions: Yes   Electrodesiccation performed over the curetted area: Yes   Final wound size (cm):  1.1 Hemostasis achieved with:  pressure, aluminum chloride and electrodesiccation Outcome: patient tolerated procedure well with no  complications   Post-procedure details: sterile dressing applied and wound care instructions given   Dressing type: bandage and petrolatum   Specimen 4 - Surgical pathology Differential Diagnosis: R/O SCC  Check Margins: No left dorsum medial  foot Epidermal / dermal shaving  Lesion diameter (cm):  1.1 Informed consent: discussed and consent obtained   Timeout: patient name, date of birth, surgical site, and procedure verified   Procedure prep:  Patient was prepped and draped in usual sterile fashion Prep type:  Isopropyl alcohol Anesthesia: the lesion was anesthetized in a standard fashion   Anesthetic:  1% lidocaine  w/ epinephrine  1-100,000 buffered w/ 8.4% NaHCO3 Hemostasis achieved with: pressure, aluminum chloride and electrodesiccation   Outcome: patient tolerated procedure well   Post-procedure details: sterile dressing applied and wound care instructions given   Dressing type: bandage and petrolatum    Destruction of lesion Complexity: extensive   Destruction method: electrodesiccation and curettage   Informed consent: discussed and consent obtained   Timeout:  patient name, date of birth, surgical site, and procedure verified Procedure prep:  Patient was prepped and draped in usual sterile fashion Prep type:  Isopropyl alcohol Anesthesia: the lesion was anesthetized in a standard fashion   Anesthetic:  1% lidocaine  w/ epinephrine  1-100,000 buffered w/ 8.4% NaHCO3 Curettage performed in three different directions: Yes   Electrodesiccation performed over the curetted area: Yes   Final wound size (cm):  1.1 Hemostasis achieved with:  pressure, aluminum chloride and electrodesiccation Outcome: patient tolerated procedure well with no complications   Post-procedure details: sterile dressing applied and wound care instructions given   Dressing type: bandage and petrolatum   Specimen 2 - Surgical pathology Differential Diagnosis: R/O SCC  Check Margins: No Start Mupirocin   ointment qd-bid  Start Doxycycline  100 mg take 1 tablet once a day with food x 14 days  INFLAMED SEBORRHEIC KERATOSIS (3) left foot x 3 (3) Symptomatic, irritating, patient would like treated.  Destruction of lesion - left foot x 3 (3) Complexity: simple   Destruction method: cryotherapy   Informed consent: discussed and consent obtained   Timeout:  patient name, date of birth, surgical site, and procedure verified Lesion destroyed using liquid nitrogen: Yes   Region frozen until ice ball extended beyond lesion: Yes   Outcome: patient tolerated procedure well with no complications   Post-procedure details: wound care instructions given   ACTINIC SKIN DAMAGE   LENTIGO   MELANOCYTIC NEVUS, UNSPECIFIED LOCATION   SKIN CANCER SCREENING   HISTORY OF DYSPLASTIC NEVUS   HISTORY OF SQUAMOUS CELL CARCINOMA   CYST OF SKIN   COUNSELING AND COORDINATION OF CARE   MEDICATION MANAGEMENT   SEBORRHEIC KERATOSIS   PURPURA (HCC)   SQUAMOUS CELL 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  Treatable, but not curable. - Violaceous macules and patches - Benign - Related to trauma, age, sun damage and/or use of blood thinners, chronic use of topical and/or oral steroids - Observe - Can use OTC arnica containing moisturizer such as Dermend Bruise Formula if desired - Call for worsening or other concerns  Return in about 1 year (around 06/12/2024) for TBSE,  hx of SCC, hx of Dysplastic nevus .  IClara Crisp, CMA, am acting as scribe for Celine Collard, MD .   Documentation: I have reviewed the above documentation for accuracy and completeness, and I agree with the above.  Celine Collard, MD

## 2023-06-13 NOTE — Patient Instructions (Addendum)

## 2023-06-20 LAB — SURGICAL PATHOLOGY

## 2023-06-24 ENCOUNTER — Telehealth: Payer: Self-pay

## 2023-06-24 NOTE — Telephone Encounter (Signed)
-----   Message from Celine Collard sent at 06/21/2023  9:13 AM EDT ----- FINAL DIAGNOSIS        1. Skin, left elbow :       EPIDERMAL INCLUSION CYST        2. Skin, left dorsum medial foot :       HYPERTROPHIC ACTINIC KERATOSIS WITH FEATURES OF A VERRUCA, INFLAMED        3. Skin, left dorsum foot :       HYPERTROPHIC ACTINIC KERATOSIS WITH FEATURES OF A VERRUCA        4. Skin, left lateral foot :       SQUAMOUS CELL CARCINOMA IN SITU, BASE INVOLVED   1- benign cyst No further treatment needed 2&3 - both PreCancer with wart virus Already treated Recheck next visit 4- Cancer = SCCis Already treated  Recheck next visit

## 2023-06-24 NOTE — Telephone Encounter (Signed)
 Discussed biopsy results with patient

## 2023-06-24 NOTE — Telephone Encounter (Signed)
 Left voice mail returning patients call.

## 2023-06-24 NOTE — Telephone Encounter (Signed)
 LM on VM please return my call, MF  1. Skin, left elbow :       EPIDERMAL INCLUSION CYST        2. Skin, left dorsum medial foot :       HYPERTROPHIC ACTINIC KERATOSIS WITH FEATURES OF A VERRUCA, INFLAMED        3. Skin, left dorsum foot :       HYPERTROPHIC ACTINIC KERATOSIS WITH FEATURES OF A VERRUCA        4. Skin, left lateral foot :       SQUAMOUS CELL CARCINOMA IN SITU, BASE INVOLVED   1- benign cyst  No further treatment needed  2&3 - both PreCancer with wart virus  Already treated  Recheck next visit  4- Cancer = SCCis  Already treated  Recheck next visit

## 2023-06-25 NOTE — Addendum Note (Signed)
 Addended by: Elta Halter on: 06/25/2023 04:13 PM   Modules accepted: Orders

## 2023-07-03 ENCOUNTER — Encounter: Payer: Self-pay | Admitting: Dermatology

## 2023-07-03 ENCOUNTER — Ambulatory Visit: Admitting: Dermatology

## 2023-07-03 DIAGNOSIS — L821 Other seborrheic keratosis: Secondary | ICD-10-CM

## 2023-07-03 DIAGNOSIS — W908XXA Exposure to other nonionizing radiation, initial encounter: Secondary | ICD-10-CM

## 2023-07-03 DIAGNOSIS — L720 Epidermal cyst: Secondary | ICD-10-CM

## 2023-07-03 DIAGNOSIS — L570 Actinic keratosis: Secondary | ICD-10-CM | POA: Diagnosis not present

## 2023-07-03 DIAGNOSIS — L578 Other skin changes due to chronic exposure to nonionizing radiation: Secondary | ICD-10-CM

## 2023-07-03 DIAGNOSIS — Z85828 Personal history of other malignant neoplasm of skin: Secondary | ICD-10-CM

## 2023-07-03 DIAGNOSIS — R234 Changes in skin texture: Secondary | ICD-10-CM | POA: Diagnosis not present

## 2023-07-03 DIAGNOSIS — L98491 Non-pressure chronic ulcer of skin of other sites limited to breakdown of skin: Secondary | ICD-10-CM

## 2023-07-03 DIAGNOSIS — L820 Inflamed seborrheic keratosis: Secondary | ICD-10-CM

## 2023-07-03 DIAGNOSIS — L729 Follicular cyst of the skin and subcutaneous tissue, unspecified: Secondary | ICD-10-CM

## 2023-07-03 DIAGNOSIS — Z8589 Personal history of malignant neoplasm of other organs and systems: Secondary | ICD-10-CM

## 2023-07-03 NOTE — Patient Instructions (Signed)

## 2023-07-03 NOTE — Progress Notes (Signed)
 Follow-Up Visit   Subjective  Wanda Baird is a 89 y.o. female who presents for the following: Irregular, crusted skin lesions, pt concerned they were missed at the last visit, and would like them treated today.   The patient has spots, moles and lesions to be evaluated, some may be new or changing and the patient may have concern these could be cancer.  The following portions of the chart were reviewed this encounter and updated as appropriate: medications, allergies, medical history  Review of Systems:  No other skin or systemic complaints except as noted in HPI or Assessment and Plan.  Objective  Well appearing patient in no apparent distress; mood and affect are within normal limits.  A focused examination was performed of the following areas: the face, arms, hands, and feet  Relevant exam findings are noted in the Assessment and Plan.  L foot x 4, B/L wrist x 4 (4) Erythematous thin papules/macules with gritty scale.  Arms x 3 (3) Erythematous stuck-on, waxy papule or plaque  Assessment & Plan  AK (ACTINIC KERATOSIS) (4) L foot x 4, B/L wrist x 4 (4) Actinic keratoses are precancerous spots that appear secondary to cumulative UV radiation exposure/sun exposure over time. They are chronic with expected duration over 1 year. A portion of actinic keratoses will progress to squamous cell carcinoma of the skin. It is not possible to reliably predict which spots will progress to skin cancer and so treatment is recommended to prevent development of skin cancer.  Recommend daily broad spectrum sunscreen SPF 30+ to sun-exposed areas, reapply every 2 hours as needed.  Recommend staying in the shade or wearing long sleeves, sun glasses (UVA+UVB protection) and wide brim hats (4-inch brim around the entire circumference of the hat). Call for new or changing lesions.  Destruction of lesion - L foot x 4, B/L wrist x 4 (4) Complexity: simple   Destruction method: cryotherapy    Informed consent: discussed and consent obtained   Timeout:  patient name, date of birth, surgical site, and procedure verified Lesion destroyed using liquid nitrogen: Yes   Region frozen until ice ball extended beyond lesion: Yes   Outcome: patient tolerated procedure well with no complications   Post-procedure details: wound care instructions given   INFLAMED SEBORRHEIC KERATOSIS (3) Arms x 3 (3) Symptomatic, irritating, patient would like treated.  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  - Recommend staying in the shade or wearing long sleeves, sun glasses (UVA+UVB protection) and wide brim hats (4-inch brim around the entire circumference of the hat). - Call for new or changing lesions.  SEBORRHEIC KERATOSIS - Stuck-on, waxy, tan-brown papules and/or plaques  -  Benign-appearing - Discussed benign etiology and prognosis. - Observe - Call for any changes  Milia - tiny firm white papules - type of cyst - benign - sometimes these will clear with nightly OTC adapalene/Differin 0.1% gel or retinol. - may be extracted  if symptomatic - observe - samples given of Aklief cream apply to aa's QHS.  HISTORY OF SQUAMOUS CELL CARCINOMA OF THE SKIN - No evidence of recurrence today - No lymphadenopathy - Recommend regular full body skin exams - Recommend daily broad spectrum sunscreen SPF 30+ to sun-exposed areas, reapply every 2 hours as needed.  - Call if any new or changing lesions are noted between office visits    Crusts of the L foot at sites of LN2 - mechanical debridement performed today.  Return for appointment as scheduled.  Arlinda Lais, CMA, am acting as scribe for Celine Collard, MD .   Documentation: I have reviewed the above documentation for accuracy and completeness, and I agree with the above.  Celine Collard, MD

## 2023-07-17 ENCOUNTER — Ambulatory Visit: Payer: Medicare Other | Admitting: Dermatology

## 2024-02-06 ENCOUNTER — Telehealth: Payer: Self-pay

## 2024-02-06 NOTE — Telephone Encounter (Signed)
 Patient called this morning asking to be worked in with Dr. Hester because she cut her knee and it has bled a lot. Patient asking to speak with Dorthea because she knows she would work her in. Advised patient Dr. Hester has really cut back on his patient load and Dr. Hester does not have any openings the remaining of the year. Patient was offered to see Dr. Raymund this morning and advised patient she was a board certified dermatologist and just as good as Dr. Tempie. Patient declined appt and said she would call Cindy back Monday. I advised patient I was the team lead up front and I would not allow Dorthea to work someone in today or Monday at this time. aw

## 2024-06-18 ENCOUNTER — Ambulatory Visit: Admitting: Dermatology
